# Patient Record
Sex: Female | Born: 1965 | Race: White | Hispanic: No | Smoking: Never smoker
Health system: Southern US, Community
[De-identification: ages and names within clinical notes are randomized; demographics above are authoritative.]

## PROBLEM LIST (undated history)

## (undated) DIAGNOSIS — D689 Coagulation defect, unspecified: Secondary | ICD-10-CM

## (undated) DIAGNOSIS — G8929 Other chronic pain: Secondary | ICD-10-CM

## (undated) DIAGNOSIS — M549 Dorsalgia, unspecified: Secondary | ICD-10-CM

## (undated) HISTORY — PX: ABDOMINAL HYSTERECTOMY: SHX81

---

## 1998-05-10 ENCOUNTER — Emergency Department (HOSPITAL_COMMUNITY): Admission: EM | Admit: 1998-05-10 | Discharge: 1998-05-10 | Payer: Self-pay | Admitting: Emergency Medicine

## 1998-07-03 ENCOUNTER — Emergency Department (HOSPITAL_COMMUNITY): Admission: EM | Admit: 1998-07-03 | Discharge: 1998-07-03 | Payer: Self-pay | Admitting: Emergency Medicine

## 1998-07-07 ENCOUNTER — Ambulatory Visit (HOSPITAL_COMMUNITY): Admission: RE | Admit: 1998-07-07 | Discharge: 1998-07-07 | Payer: Self-pay | Admitting: Obstetrics and Gynecology

## 1998-07-10 ENCOUNTER — Ambulatory Visit (HOSPITAL_COMMUNITY): Admission: RE | Admit: 1998-07-10 | Discharge: 1998-07-10 | Payer: Self-pay | Admitting: Obstetrics and Gynecology

## 1998-09-06 ENCOUNTER — Emergency Department (HOSPITAL_COMMUNITY): Admission: EM | Admit: 1998-09-06 | Discharge: 1998-09-06 | Payer: Self-pay | Admitting: Emergency Medicine

## 1998-10-22 ENCOUNTER — Emergency Department (HOSPITAL_COMMUNITY): Admission: EM | Admit: 1998-10-22 | Discharge: 1998-10-22 | Payer: Self-pay | Admitting: Emergency Medicine

## 1999-01-19 ENCOUNTER — Emergency Department (HOSPITAL_COMMUNITY): Admission: EM | Admit: 1999-01-19 | Discharge: 1999-01-19 | Payer: Self-pay | Admitting: Emergency Medicine

## 1999-02-20 ENCOUNTER — Encounter (INDEPENDENT_AMBULATORY_CARE_PROVIDER_SITE_OTHER): Payer: Self-pay | Admitting: *Deleted

## 1999-02-20 ENCOUNTER — Inpatient Hospital Stay (HOSPITAL_COMMUNITY): Admission: AD | Admit: 1999-02-20 | Discharge: 1999-02-28 | Payer: Self-pay | Admitting: Obstetrics and Gynecology

## 1999-02-20 ENCOUNTER — Encounter: Payer: Self-pay | Admitting: Obstetrics and Gynecology

## 1999-02-21 ENCOUNTER — Encounter: Payer: Self-pay | Admitting: Obstetrics and Gynecology

## 1999-02-22 ENCOUNTER — Encounter: Payer: Self-pay | Admitting: Obstetrics and Gynecology

## 1999-04-09 ENCOUNTER — Emergency Department (HOSPITAL_COMMUNITY): Admission: EM | Admit: 1999-04-09 | Discharge: 1999-04-09 | Payer: Self-pay | Admitting: Emergency Medicine

## 1999-04-17 ENCOUNTER — Other Ambulatory Visit: Admission: RE | Admit: 1999-04-17 | Discharge: 1999-04-17 | Payer: Self-pay | Admitting: Obstetrics and Gynecology

## 1999-05-08 ENCOUNTER — Emergency Department (HOSPITAL_COMMUNITY): Admission: EM | Admit: 1999-05-08 | Discharge: 1999-05-08 | Payer: Self-pay | Admitting: *Deleted

## 1999-06-20 ENCOUNTER — Encounter: Payer: Self-pay | Admitting: Nephrology

## 1999-06-20 ENCOUNTER — Encounter: Admission: RE | Admit: 1999-06-20 | Discharge: 1999-06-20 | Payer: Self-pay | Admitting: Nephrology

## 1999-07-20 ENCOUNTER — Emergency Department (HOSPITAL_COMMUNITY): Admission: EM | Admit: 1999-07-20 | Discharge: 1999-07-20 | Payer: Self-pay | Admitting: *Deleted

## 1999-08-06 ENCOUNTER — Emergency Department (HOSPITAL_COMMUNITY): Admission: EM | Admit: 1999-08-06 | Discharge: 1999-08-06 | Payer: Self-pay | Admitting: Emergency Medicine

## 1999-09-23 ENCOUNTER — Emergency Department (HOSPITAL_COMMUNITY): Admission: EM | Admit: 1999-09-23 | Discharge: 1999-09-23 | Payer: Self-pay | Admitting: Emergency Medicine

## 2000-05-21 ENCOUNTER — Other Ambulatory Visit: Admission: RE | Admit: 2000-05-21 | Discharge: 2000-05-21 | Payer: Self-pay | Admitting: Obstetrics and Gynecology

## 2000-08-06 ENCOUNTER — Emergency Department (HOSPITAL_COMMUNITY): Admission: EM | Admit: 2000-08-06 | Discharge: 2000-08-06 | Payer: Self-pay | Admitting: Emergency Medicine

## 2001-05-14 ENCOUNTER — Encounter (INDEPENDENT_AMBULATORY_CARE_PROVIDER_SITE_OTHER): Payer: Self-pay | Admitting: Specialist

## 2001-05-15 ENCOUNTER — Inpatient Hospital Stay (HOSPITAL_COMMUNITY): Admission: RE | Admit: 2001-05-15 | Discharge: 2001-05-18 | Payer: Self-pay | Admitting: Obstetrics and Gynecology

## 2001-07-18 ENCOUNTER — Emergency Department (HOSPITAL_COMMUNITY): Admission: EM | Admit: 2001-07-18 | Discharge: 2001-07-18 | Payer: Self-pay | Admitting: Emergency Medicine

## 2001-08-22 ENCOUNTER — Emergency Department (HOSPITAL_COMMUNITY): Admission: EM | Admit: 2001-08-22 | Discharge: 2001-08-22 | Payer: Self-pay | Admitting: Emergency Medicine

## 2002-04-04 ENCOUNTER — Emergency Department (HOSPITAL_COMMUNITY): Admission: EM | Admit: 2002-04-04 | Discharge: 2002-04-04 | Payer: Self-pay | Admitting: Emergency Medicine

## 2002-05-16 ENCOUNTER — Emergency Department (HOSPITAL_COMMUNITY): Admission: EM | Admit: 2002-05-16 | Discharge: 2002-05-16 | Payer: Self-pay | Admitting: Emergency Medicine

## 2002-09-21 ENCOUNTER — Other Ambulatory Visit: Admission: RE | Admit: 2002-09-21 | Discharge: 2002-09-21 | Payer: Self-pay | Admitting: Obstetrics and Gynecology

## 2003-03-07 ENCOUNTER — Emergency Department (HOSPITAL_COMMUNITY): Admission: EM | Admit: 2003-03-07 | Discharge: 2003-03-07 | Payer: Self-pay | Admitting: Emergency Medicine

## 2003-09-08 ENCOUNTER — Encounter (INDEPENDENT_AMBULATORY_CARE_PROVIDER_SITE_OTHER): Payer: Self-pay | Admitting: Specialist

## 2003-09-08 ENCOUNTER — Inpatient Hospital Stay (HOSPITAL_COMMUNITY): Admission: AD | Admit: 2003-09-08 | Discharge: 2003-09-08 | Payer: Self-pay | Admitting: *Deleted

## 2003-09-08 ENCOUNTER — Ambulatory Visit (HOSPITAL_COMMUNITY): Admission: RE | Admit: 2003-09-08 | Discharge: 2003-09-08 | Payer: Self-pay | Admitting: Obstetrics and Gynecology

## 2003-12-12 ENCOUNTER — Ambulatory Visit (HOSPITAL_COMMUNITY): Admission: RE | Admit: 2003-12-12 | Discharge: 2003-12-12 | Payer: Self-pay | Admitting: Obstetrics and Gynecology

## 2004-02-26 ENCOUNTER — Emergency Department (HOSPITAL_COMMUNITY): Admission: EM | Admit: 2004-02-26 | Discharge: 2004-02-26 | Payer: Self-pay | Admitting: Emergency Medicine

## 2004-03-04 ENCOUNTER — Emergency Department (HOSPITAL_COMMUNITY): Admission: EM | Admit: 2004-03-04 | Discharge: 2004-03-04 | Payer: Self-pay | Admitting: Emergency Medicine

## 2004-04-28 ENCOUNTER — Emergency Department (HOSPITAL_COMMUNITY): Admission: EM | Admit: 2004-04-28 | Discharge: 2004-04-28 | Payer: Self-pay | Admitting: *Deleted

## 2004-07-21 ENCOUNTER — Emergency Department (HOSPITAL_COMMUNITY): Admission: EM | Admit: 2004-07-21 | Discharge: 2004-07-21 | Payer: Self-pay | Admitting: Emergency Medicine

## 2004-08-15 ENCOUNTER — Emergency Department (HOSPITAL_COMMUNITY): Admission: EM | Admit: 2004-08-15 | Discharge: 2004-08-15 | Payer: Self-pay

## 2004-08-23 ENCOUNTER — Emergency Department (HOSPITAL_COMMUNITY): Admission: EM | Admit: 2004-08-23 | Discharge: 2004-08-23 | Payer: Self-pay | Admitting: Emergency Medicine

## 2004-09-10 ENCOUNTER — Emergency Department (HOSPITAL_COMMUNITY): Admission: EM | Admit: 2004-09-10 | Discharge: 2004-09-10 | Payer: Self-pay | Admitting: Emergency Medicine

## 2004-09-25 ENCOUNTER — Emergency Department (HOSPITAL_COMMUNITY): Admission: EM | Admit: 2004-09-25 | Discharge: 2004-09-25 | Payer: Self-pay | Admitting: Emergency Medicine

## 2004-10-05 ENCOUNTER — Emergency Department (HOSPITAL_COMMUNITY): Admission: EM | Admit: 2004-10-05 | Discharge: 2004-10-05 | Payer: Self-pay | Admitting: Emergency Medicine

## 2004-11-17 ENCOUNTER — Emergency Department (HOSPITAL_COMMUNITY): Admission: EM | Admit: 2004-11-17 | Discharge: 2004-11-17 | Payer: Self-pay | Admitting: Emergency Medicine

## 2005-05-22 ENCOUNTER — Emergency Department (HOSPITAL_COMMUNITY): Admission: EM | Admit: 2005-05-22 | Discharge: 2005-05-22 | Payer: Self-pay | Admitting: Emergency Medicine

## 2005-06-05 ENCOUNTER — Emergency Department (HOSPITAL_COMMUNITY): Admission: EM | Admit: 2005-06-05 | Discharge: 2005-06-05 | Payer: Self-pay | Admitting: Emergency Medicine

## 2005-06-20 ENCOUNTER — Emergency Department (HOSPITAL_COMMUNITY): Admission: EM | Admit: 2005-06-20 | Discharge: 2005-06-20 | Payer: Self-pay | Admitting: Emergency Medicine

## 2005-07-08 ENCOUNTER — Emergency Department (HOSPITAL_COMMUNITY): Admission: EM | Admit: 2005-07-08 | Discharge: 2005-07-08 | Payer: Self-pay | Admitting: *Deleted

## 2005-07-13 ENCOUNTER — Emergency Department (HOSPITAL_COMMUNITY): Admission: EM | Admit: 2005-07-13 | Discharge: 2005-07-13 | Payer: Self-pay | Admitting: Emergency Medicine

## 2005-08-04 ENCOUNTER — Emergency Department (HOSPITAL_COMMUNITY): Admission: EM | Admit: 2005-08-04 | Discharge: 2005-08-05 | Payer: Self-pay | Admitting: Emergency Medicine

## 2005-08-29 ENCOUNTER — Emergency Department (HOSPITAL_COMMUNITY): Admission: EM | Admit: 2005-08-29 | Discharge: 2005-08-29 | Payer: Self-pay | Admitting: Emergency Medicine

## 2005-11-24 ENCOUNTER — Emergency Department (HOSPITAL_COMMUNITY): Admission: EM | Admit: 2005-11-24 | Discharge: 2005-11-24 | Payer: Self-pay | Admitting: Emergency Medicine

## 2005-12-17 ENCOUNTER — Encounter: Admission: RE | Admit: 2005-12-17 | Discharge: 2005-12-17 | Payer: Self-pay | Admitting: *Deleted

## 2005-12-18 ENCOUNTER — Encounter: Payer: Self-pay | Admitting: Obstetrics and Gynecology

## 2006-01-11 ENCOUNTER — Emergency Department (HOSPITAL_COMMUNITY): Admission: EM | Admit: 2006-01-11 | Discharge: 2006-01-12 | Payer: Self-pay | Admitting: Emergency Medicine

## 2006-02-16 ENCOUNTER — Emergency Department (HOSPITAL_COMMUNITY): Admission: EM | Admit: 2006-02-16 | Discharge: 2006-02-16 | Payer: Self-pay | Admitting: Emergency Medicine

## 2006-04-25 ENCOUNTER — Encounter: Admission: RE | Admit: 2006-04-25 | Discharge: 2006-04-25 | Payer: Self-pay | Admitting: Gastroenterology

## 2006-04-30 ENCOUNTER — Encounter: Admission: RE | Admit: 2006-04-30 | Discharge: 2006-04-30 | Payer: Self-pay | Admitting: Gastroenterology

## 2006-07-19 ENCOUNTER — Emergency Department (HOSPITAL_COMMUNITY): Admission: EM | Admit: 2006-07-19 | Discharge: 2006-07-19 | Payer: Self-pay | Admitting: Emergency Medicine

## 2006-11-07 ENCOUNTER — Emergency Department (HOSPITAL_COMMUNITY): Admission: EM | Admit: 2006-11-07 | Discharge: 2006-11-07 | Payer: Self-pay | Admitting: Emergency Medicine

## 2007-01-31 ENCOUNTER — Emergency Department (HOSPITAL_COMMUNITY): Admission: EM | Admit: 2007-01-31 | Discharge: 2007-01-31 | Payer: Self-pay | Admitting: *Deleted

## 2007-08-15 ENCOUNTER — Emergency Department (HOSPITAL_COMMUNITY): Admission: EM | Admit: 2007-08-15 | Discharge: 2007-08-15 | Payer: Self-pay | Admitting: Emergency Medicine

## 2008-02-28 ENCOUNTER — Inpatient Hospital Stay (HOSPITAL_COMMUNITY): Admission: AD | Admit: 2008-02-28 | Discharge: 2008-02-28 | Payer: Self-pay | Admitting: Obstetrics and Gynecology

## 2008-03-09 ENCOUNTER — Ambulatory Visit (HOSPITAL_COMMUNITY): Admission: RE | Admit: 2008-03-09 | Discharge: 2008-03-09 | Payer: Self-pay | Admitting: Obstetrics and Gynecology

## 2008-04-28 ENCOUNTER — Encounter: Admission: RE | Admit: 2008-04-28 | Discharge: 2008-04-28 | Payer: Self-pay | Admitting: Chiropractic Medicine

## 2008-08-30 ENCOUNTER — Encounter: Admission: RE | Admit: 2008-08-30 | Discharge: 2008-08-30 | Payer: Self-pay | Admitting: Family Medicine

## 2009-09-04 ENCOUNTER — Encounter: Admission: RE | Admit: 2009-09-04 | Discharge: 2009-09-04 | Payer: Self-pay | Admitting: Gastroenterology

## 2010-09-08 ENCOUNTER — Encounter: Payer: Self-pay | Admitting: Neurology

## 2010-09-09 ENCOUNTER — Encounter: Payer: Self-pay | Admitting: Gastroenterology

## 2010-09-09 ENCOUNTER — Encounter: Payer: Self-pay | Admitting: Obstetrics and Gynecology

## 2010-10-23 ENCOUNTER — Other Ambulatory Visit: Payer: Self-pay | Admitting: Neurology

## 2010-10-23 DIAGNOSIS — R519 Headache, unspecified: Secondary | ICD-10-CM

## 2010-10-23 DIAGNOSIS — R2681 Unsteadiness on feet: Secondary | ICD-10-CM

## 2010-10-23 DIAGNOSIS — R4781 Slurred speech: Secondary | ICD-10-CM

## 2010-12-23 ENCOUNTER — Emergency Department (HOSPITAL_COMMUNITY)
Admission: EM | Admit: 2010-12-23 | Discharge: 2010-12-23 | Disposition: A | Discharge status: Home / Self Care | Payer: BC Managed Care – PPO | Attending: Emergency Medicine | Admitting: Emergency Medicine

## 2010-12-23 DIAGNOSIS — R269 Unspecified abnormalities of gait and mobility: Secondary | ICD-10-CM | POA: Insufficient documentation

## 2010-12-23 DIAGNOSIS — G43909 Migraine, unspecified, not intractable, without status migrainosus: Secondary | ICD-10-CM | POA: Insufficient documentation

## 2010-12-23 DIAGNOSIS — R112 Nausea with vomiting, unspecified: Secondary | ICD-10-CM | POA: Insufficient documentation

## 2010-12-23 DIAGNOSIS — H538 Other visual disturbances: Secondary | ICD-10-CM | POA: Insufficient documentation

## 2010-12-23 DIAGNOSIS — R29898 Other symptoms and signs involving the musculoskeletal system: Secondary | ICD-10-CM | POA: Insufficient documentation

## 2011-01-01 NOTE — Op Note (Signed)
NAME:  Kelsey Lang, ANGELO NO.:  [id number]   MEDICAL RECORD NO.:  [id number]          PATIENT TYPE:  AMB   LOCATION:  SDC                           FACILITY:  [facility]   PHYSICIAN:  Juluis Mire, M.D.   DATE OF BIRTH:  08/10/1966   DATE OF PROCEDURE:  03/09/2008  DATE OF DISCHARGE:                               OPERATIVE REPORT   PREOPERATIVE DIAGNOSIS:  Chronic pelvic pain.   POSTOPERATIVE DIAGNOSIS:  Chronic pelvic pain.   PROCEDURE:  1. Open laparoscopy.  2. Cautery of peritoneal defect.  3. Injection of vaginal cuff with a combination of Marcaine and      dexamethasone.   SURGEON:  Juluis Mire, MD   ANESTHESIA:  General.   ESTIMATED BLOOD LOSS:  Minimal.   PACKS AND DRAINS:  None.   INTRAOPERATIVE BLOOD PLACED:  None.   COMPLICATIONS:  None.   INDICATIONS:  Dictated history and physical.   PROCEDURE:  The patient was taken to the OR and placed in supine  position.  After satisfactory level of general endotracheal anesthesia  was obtained, the abdomen, perineum, and vagina were prepped out with  Betadine and draped as a sterile field.  Bladder was emptied by  catheterization.  A subumbilical incision was made with a knife.  The  incision was extended through the subcutaneous tissue.  Fascia was  entered sharp and incision fashioned laterally.  The peritoneum was  entered with blunt pressure.  The open laparoscopic trocar was inserted  and deployed.  Having insufflated with carbon dioxide, laparoscope was  introduced.  There was no evidence of injury to adjacent organs.  A 5-mm  trocar was put in place in suprapubic area under direct visualization.  Visualization revealed the uterus and the right ovary be missing.  The  left ovary was free and unremarkable.  She had one peritoneal defect on  the right pelvic side wall well above the ureter.  Otherwise, there were  no abnormalities.  The appendix was visualized, a normal upper abdomen  including the  liver were clear.  Using the bipolar, we cauterized along  the pelvic brim, the area where the ovary had been and the peritoneal  defect.  This was well above the ureter.  We then went vaginally and  injected the vaginal cuff with a combination of dexamethasone and 0.5%  Marcaine.  We had completely crossed the cuff making sure all the area  was injected appropriately.  We had some minimal bleeding from that.  At  this point in time, we re-looked through the laparoscope, everything was  clear.  The abdomen was deinflated with carbon dioxide.  All trocars  were removed.  Subumbilical fascia was closed with a figure-of-eight of  0 Vicryl, skin with interrupted subcuticular with 4-0 Vicryl.  The  suprapubic incision was closed with Dermabond.  Sponge, instrument, and  needle count was correct by circulating nurse x2.  The patient was taken  out of the dorsal supine position, and once alert and extubated,  transferred to recovery room in good condition.      Juluis Mire,  M.D.  Electronically Signed     JSM/MEDQ  D:  03/09/2008  T:  03/10/2008  Job:  [id number]

## 2011-01-01 NOTE — H&P (Signed)
NAME:  Kelsey Lang, WACHTER NO.:  [id number]   MEDICAL RECORD NO.:  [id number]          PATIENT TYPE:  AMB   LOCATION:  SDC                           FACILITY:  [facility]   PHYSICIAN:  Juluis Mire, M.D.   DATE OF BIRTH:  August 03, 1966   DATE OF ADMISSION:  03/09/2008  DATE OF DISCHARGE:  09/07/2007                              HISTORY & PHYSICAL   The patient is a 45 year old gravida 2, para 2 female presents for  diagnostic laparoscopy and injection of vaginal cuff in relation to the  present admission.  The patient had a previous laparoscopic-assisted  vaginal hysterectomy for management of pelvic pain and discomfort.  She  had persistent right lower quadrant discomfort and pain, underwent a  right salpingo-oophorectomy.  She still has some right-sided pain.  She  has had previous GI and Urology evaluation which was negative.  At the  present time, we are going to proceed with repeat laparoscopy for  evaluation.  We will also inject the vaginal cuff.   In terms of allergies, she is allergic to TYLENOL and PENICILLIN.   MEDICATIONS:  Topamax 100 mg 2 times daily, Plavix 1 time daily,  verapamil once a day, and then otherwise supplements.  She also is using  Demerol for pain and Phenergan for nausea.   PAST MEDICAL HISTORY:  1. 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  She did have      a marked decrease in renal function that gradually improved.  She      had been followed by Dr. Darrick Penna for that.  2. She is found to have an antiphospholipid syndrome in the form of      anticardiolipin antibody of the IgM type.  This was mildly      elevated.  She has had no history of deep venous thrombosis.  3. She has a history of chronic migraines.  4. She has a history of some mild elevated blood pressures, has been      on medications in the past.   PAST OBSTETRICAL HISTORY:  She has had 2 cesarean sections.   SURGICAL HISTORY:  In 1998, she had a laparoscopy.  In  1989, she had  laparoscopy and uterine suspension.  In 1992, she had a repeat  laparoscopy for chronic pelvic pain with find of endometriosis and  uterine fibroids.  In 1994, she had a laparoscopy.  In 1996, she had a  laparoscopy and then subsequently in 2002, she had a laparoscopic-  assisted vaginal hysterectomy.  Subsequently, in 2005, she had a  laparoscopic removal of right tube and ovary.  Again, she is continued  to have pain.  Ultrasound evaluations have been unremarkable.   FAMILY HISTORY:  Noncontributory.   SOCIAL HISTORY:  Reveals no tobacco or alcohol use.   REVIEW OF SYSTEMS:  Noncontributory.   PHYSICAL EXAMINATION:  VITAL SIGNS:  The patient is afebrile, stable  vital signs.  HEENT:  The patient is normocephalic.  Pupils equal, round, and reactive  to light and accommodation.  Extraocular movements were intact.  Sclerae  and conjunctivae are clear.  Oropharynx clear.  NECK:  Without thyromegaly.  BREASTS:  No discrete masses.  LUNGS:  Clear.  CARDIOVASCULAR SYSTEM:  Regular rate without murmurs or gallops.  ABDOMINAL:  Benign.  No mass, organomegaly, or tenderness.  PELVIC:  Normal external genitalia.  Vaginal mucosa is clear.  Pelvis  intact.  She has some right-sided tenderness.  Left adnexa unremarkable.  EXTREMITIES:  Trace edema.  NEURO:  Alert and oriented.   IMPRESSION:  1. Chronic pelvic pain despite previous laparoscopic-assisted vaginal      hysterectomy, right salpingo-oophorectomy.  2. History of antiphospholipid with mildly elevated anticardiolipin.   PLAN:  For prophylaxis against DVTs, we will place her on compression  stockings.  We are going to avoid heparin.  She has had a history of  hematoma formation in the past.  The risks of surgery explained  including the risk of infection.  The risk of hemorrhage that could  require transfusion with the risk of AIDS or hepatitis.  Risk of injury  to adjacent organs including bladder, bowel, or ureters  that could  require further exploratory surgery.  Risk of deep venous thrombosis and  pulmonary embolus.      Juluis Mire, M.D.  Electronically Signed     JSM/MEDQ  D:  03/09/2008  T:  03/10/2008  Job:  [id number]

## 2011-01-04 NOTE — Op Note (Signed)
[facility]  Patient:    Kelsey Lang, Kelsey Lang [facility] Visit Number: [id number] MRN: [id number]          Service Type: DSU Location: DAY Attending Physician:  Frederich Balding Proc. Date: 05/14/01 Admit Date:  05/14/2001                             Operative Report  PREOPERATIVE DIAGNOSES:  Pelvic endometriosis, endometrial polyp with pathology pending.  POSTOPERATIVE DIAGNOSES:  Pelvic endometriosis, endometrial polyp with pathology pending.  OPERATIVE PROCEDURE:  Laparoscopically-assisted vaginal hysterectomy using open laparoscopy.  SURGEON:  Juluis Mire, M.D.  ASSISTANT:  Trevor Iha, M.D.  ANESTHESIA:  General endotracheal.  ESTIMATED BLOOD LOSS:  300 cc.  PACKS AND DRAINS:  None.  INTRAOPERATIVE FLUID REPLACEMENT:  None.  COMPLICATIONS:  None.  INDICATION:  Dictated in the history and physical.  DESCRIPTION OF PROCEDURE:  The patient taken to the OR and placed in supine position.  After satisfactory level of general endotracheal obtained, the patient was placed in the dorsal lithotomy position using the Allen stirrups. The abdomen, perineum, and vagina were prepped out with Betadine.  Exam under anesthesia revealed the uterus to be mid position, normal size and shape, adnexa unremarkable.  Bladder was emptied by in-and-out catheterization.  A Hulka tenaculum was put in place and secured.  The patient then draped out as a sterile field.  A subumbilical incision was made with the knife carried to the subcutaneous tissue.  The fascia was then entered sharply and incision extended laterally.  The peritoneum was then entered sharply.  There was no evidence of any periumbilical adhesions.  Two lateral fascial sutures were put in place of 0 Vicryl.  The Hasson cannula was then put in place and secured with the Vicryl sutures.  The abdomen was insufflated with carbon dioxide. The laparoscope was introduced.  There was no evidence  of injury to adjacent organs.  A 5 mm trocar was put in place in the suprapubic area and the left lower quadrant lateral to the epigastric vessels.  The uterus was of normal size and shape.  Tubes and ovaries were unremarkable.  She did have areas of endometriosis in the cul-de-sac area.  The appendix was visualized and noted to be normal.  Upper abdomen, including the liver and tip of the gallbladder, were clear.  Next, the Ligasure was brought into place and placed through the subumbilical port.  A 5 mm laparoscope was introduced into the port in the left lower quadrant.  We began working on the left adnexa.  First the left uteroovarian pedicle was cauterized with the Ligasure and incised.  The left tube and mesosalpinx was cauterized and incised.  The left tube and mesosalpinx was cauterized and incised.  The left round ligament was cauterized and incised.  We had complete separation of the adnexa and good hemostasis.  We then went to the right side.  The right uteroovarian pedicle was cauterized and incised.  The right tube and mesosalpinx were cauterized and incised.  The right round ligament was cauterized and incised.  Good freeing of the adnexa and excellent hemostasis.  The 5 mm laparoscope was then removed, and the operative laparoscope was reintroduced to the subumbilical port.  Visualization with good hemostasis and adequate freeing of the adnexa. Both ureters were easily identified in their course of the pelvic sidewall and uninvolved in the operative procedure.  At this point in time, we decided  to proceed vaginally.  The patient was then deflated of carbon dioxide, laparoscope was removed, and the legs were repositioned.  A weighted speculum was placed in the vaginal vault.  The cervix was grasped with a Christella Hartigan tenaculum.  The cul-de-sac was entered sharply.  Both uterosacral ligaments were clamped, cut, and suture ligated with 0 Vicryl with these being held.  The reflection  of the vaginal mucosa anteriorly was incised, and we began sharply dissecting bilaterally off the lower uterine segment.  The paracervical tissue was clamped, cut, and suture ligated with 0 Vicryl.  The vesicouterine space was then entered sharply and a retractor put in place to retract the bladder superiorly.  Using the clamp, cut, and tie technique, with suture ligature of 0 Vicryl, the parametrium was serially separated from the sides of the uterus.  The uterus was then flipped and the remaining pedicles clamped and cut and the uterus passed off the operative field.  The remaining pedicle was ligated with free tie of 0 Vicryl.  The posterior cuff was then run with a running suture of 0 Vicryl.  The vaginal mucosa was reapproximated in a vertical fashion with figure-of-eights of 0 Vicryl.  Foley was placed to straight drain with retrieval of adequate amount of clear urine.  We had good hemostasis.  The weighted speculum was then removed.  Sponge and a sponge stick was placed in the vaginal vault.  The laparoscope was then reintroduced.  The abdomen was reinflated with carbon dioxide.  Visualization revealed some oozing from the top of the vaginal cuff brought under control with the bipolar.  She also had some bleeding from the bladder base brought under control with the bipolar.  Otherwise, we had excellent hemostasis and good clear urine output.  The abdomen was deflated of its carbon dioxide, all trocars remained.  The subumbilical fascia was closed with a figure-of-eight of 0 Vicryl.  The skin was closed with interrupted subcuticulars of 4-0 Vicryl.  Suprapubic incision was covered with Steri-Strips.  The sponge and sponge stick was then removed.  Sponge, needle, and instrument counts were reported as correct by the circulating nurse x 2. Foley catheter remained clear at the time of closure.  The patient tolerated the procedure well and returned to the recovery room in good  condition. Attending Physician:  Frederich Balding DD:  05/14/01 TD:  05/14/01 Job: [id number]

## 2011-01-04 NOTE — H&P (Signed)
[facility]  Patient:    Kelsey Lang, Kelsey Lang Visit Number: [id number] MRN: [id number]          Service Type: Attending:  Juluis Mire, M.D. Dictated by:   Juluis Mire, M.D. Adm. Date:  05/14/01                           History and Physical  HISTORY OF PRESENT ILLNESS:  [age]-year-old gravida 2, para 2, married white female, presents for laparoscopic-assisted vaginal hysterectomy for management of symptomatic pelvic endometriosis.  In relation to the present admission, the patient has a long-standing history of chronic pelvic pain and abnormal bleeding, thought to be secondary to pelvic endometriosis.  She presented to the office complaining of worsening menstrual irregularities.  Her cycles will last at the present time up to approximately two weeks.  During that time she has on and off heavy bleeding. She is also having significant increasing discomfort and pain with this.  We are somewhat limited in terms of management.  She does have an antiphospholipid syndrome with a positive anticardiolipin.  Therefore, birth control pills are contraindicated.  Because of continued bleeding, we did a saline infusion ultrasound.  She did have a large intrauterine polyp.  Both ovaries, otherwise, appeared to be normal.  She has had numerous laparoscopies in the past for management of pelvic pain with the finding of endometriosis. In view of the bleeding, the polyp, and the known endometriosis, and the inability to use birth control pills, the patient has decided to proceed with more aggressive therapy in the form of laparoscopic-assisted vaginal hysterectomy, for which she is admitted at the present time.  ALLERGIES:  In terms of allergies, the patient is allergic to ______  and PENICILLIN.  MEDICATIONS:  Lopressor for migraine headaches.  Periactin.  PAST MEDICAL HISTORY:  Is significant in that with her last pregnancy she developed HELLP syndrome.   She had a marked decrease in her renal function. This has gradually improved, and she is followed by Dr. Darrick Penna for that. She was also found to have an antiphospholipid antibody in the form of anticardiolipin of the IgM type.  This is mildly elevated.  She has had further evaluation for hematologic disorders which have been negative.  She does have a history of migraines, for which she is under active management. She has also had mildly elevated blood pressures.  OBSTETRICAL HISTORY:  She has had two cesarean sections.  From a surgical standpoint, in 1998, she had a laparoscopy with the finding of pelvic endometriosis.  In 1989, she had a subsequent laparoscopic uterine suspension. She then had a repeat laparoscopy done in 1992, with the finding of pelvic endometriosis and a small uterine fibroid.  Because of continued symptomatology in relation to her pelvis, she had a repeat laparoscopy in 1994, again with the finding of recurrent pelvic endometriosis that was actively treated.  She had one more laparoscopy that was done in 1996.  At that time we did not really see any active endometriosis to speak of.  FAMILY HISTORY:  Noncontributory.  SOCIAL HISTORY:  No tobacco or alcohol use.  REVIEW OF SYSTEMS:  Noncontributory.  PHYSICAL EXAMINATION:  VITAL SIGNS:  Afebrile, stable vital signs.  HEENT:  Normocephalic.  Pupils are equal, round, and reactive to light and accommodation.  Extraocular movements intact.  Sclerae and conjunctivae clear. Oropharynx clear.  NECK:  Without thyromegaly.  BREASTS:  No discrete masses.  LUNGS:  Clear.  CARDIOVASCULAR:  Regular rhythm and rate.  No murmurs or gallops.  ABDOMEN:  Benign.  No masses or organomegaly or tenderness.  Does have well-healed low transverse and umbilical incisions.  PELVIC:  Normal external genitalia.  Vaginal mucosa clear.  Cervix unremarkable.  Uterus ______ normal size.  Adnexa unremarkable.  EXTREMITIES:  Trace  edema.  NEUROLOGIC:  Grossly within normal limits.  IMPRESSION: 1. Menorrhagia and dysmenorrhea secondary to intrauterine polyp and possible    recurrent endometriosis. 2. History of antiphospholipid syndrome. 3. History of borderline renal function.  PLAN:  After discussion of options, the patient will proceed with laparoscopic-assisted vaginal hysterectomy.  The risks of surgery have been discussed including the risk of anesthesia; the risk of infection; the risk of hemorrhage which could necessitate transfusion with the risk of AIDS or hepatitis; the risk of injury to adjacent organs including bladder, bowel, ureters which could require further exploratory surgery; the risk of deep vein thrombosis and pulmonary embolus.  The patient appears to understand indications and risks. Dictated by:   Juluis Mire, M.D. Attending:  Juluis Mire, M.D. DD:  05/14/01 TD:  05/14/01 Job: [id number]

## 2011-01-04 NOTE — Discharge Summary (Signed)
[facility]  Patient:    Kelsey Lang, Kelsey Lang [facility] Visit Number: [id number] MRN: [id number]          Service Type: SUR Location: 4W 0481 01 Attending Physician:  Frederich Balding Dictated by:   Juluis Mire, M.D. Admit Date:  05/14/2001 Disc. Date: 05/18/01                             Discharge Summary  ADMISSION DIAGNOSES: 1. Menorrhagia and dysmenorrhea secondary to intrauterine polyp and possible    recurrent pelvic endometriosis. 2. History of antiphospholipid syndrome. 3. History of borderline renal function.  DISCHARGE DIAGNOSES: 1. Menorrhagia and dysmenorrhea secondary to intrauterine polyp and possible    recurrent pelvic endometriosis. 2. History of antiphospholipid syndrome. 3. History of borderline renal function.  OPERATIVE PROCEDURE:  Laparoscopic assisted vaginal hysterectomy.  For history and physical see dictated note.  COURSE IN THE HOSPITAL:  The patient underwent the above noted surgery. Postoperatively she did well.  On her first postoperative day she was having trouble with some nausea.  Her postoperative hemoglobin was 7.5 which was within the drop that we had anticipated from the amount of blood loss. Rechecked that afternoon it had risen to 8.0.  We maintained her in the hospital throughout the day and through the night she had worsening nausea and vomiting.  Her abdominal exam was benign.  Abdomen was soft.  She had positive bowel sounds and actually was passing flatus on her second postoperative day. Her hemoglobin; however, had dropped down to 6.7.  We were not sure whether this was from further hydration.  Therefore, we continued to watch her.  The nausea and vomiting continued to be a problem despite trying alternative pain medications.  We ended up starting back on the morphine pump and restarting her IV and her hemoglobin, by that afternoon, had dropped down to 6.3.  In view of the nausea and vomiting and  slowly declining blood count we decided to transfuse the patient with two units of packed red blood cells.  The risks were discussed, including the risk of AIDS or hepatitis.  She underwent transfusion of two units of packed red blood cells through the evening of the 28th.  The next morning her hemoglobin was 10.3.  She was feeling much better at that point in time.  She had a great day on the 29th, tolerating her diet, ambulating without difficulty, and overall feeling much better.  On the morning of the 30th she was afebrile.  Her abdomen was soft and nontender.  Bowel sounds were active.  She was passing flatus.  She was urinating without difficulty.  She was ambulating without difficulty.  She had no active vaginal bleeding and was ready for discharge.  Terms and complications are noted above.  The patient discharged home in stable condition by physician; routine postop instructions were given.  She is to avoid heavy lifting, vaginal entrance, or driving her car.  Discharged home on Demerol for pain and Zofran for nausea.  She is to report fever, nausea, vomiting, increased abdominal pain, or active vaginal bleeding. Will follow up in the office in one week. Dictated by:   Juluis Mire, M.D. Attending Physician:  Frederich Balding DD:  05/18/01 TD:  05/18/01 Job: [id number]

## 2011-01-04 NOTE — Op Note (Signed)
NAME:  Kelsey Lang, Kelsey Lang                        ACCOUNT NO.:  [id number]   MEDICAL RECORD NO.:  [id number]                   PATIENT TYPE:  AMB   LOCATION:  SDC                                  FACILITY:  [facility]   PHYSICIAN:  Jamison Neighbor, M.D.               DATE OF BIRTH:  1965/10/30   DATE OF PROCEDURE:  09/08/2003  DATE OF DISCHARGE:                                 OPERATIVE REPORT   PREOPERATIVE DIAGNOSIS:  Right-sided pelvic pain.   SECONDARY DIAGNOSIS:  Ovarian cyst.   POSTOPERATIVE DIAGNOSIS:  Right-sided pelvic pain.   PROCEDURE:  Cystoscopy, bilateral retrogrades, and bilateral stent  placement.   SURGEON:  Jamison Neighbor, M.D.   ANESTHESIA:  General.   COMPLICATIONS:  None.   DRAINS:  6 French open-ended ureteral catheters.   HISTORY:  This 45 year old female underwent evaluation of unexplained pelvic  pain.  The patient was evaluated with cystoscopy, which revealed an  unremarkable bladder.  Renal ultrasound showed unremarkable kidneys.  Pelvic  ultrasound showed a large cystic structure on the right-hand side.  The  patient's pain was felt to be due to that cystic structure, and she is now  to undergo laparoscopic removal of the ovary by Kelsey Lang, M.D.  The  patient needs to have stents placed in preparation for the procedure in  order to aid Dr. Arelia Lang in identifying the ureters.  The patient understands  the risks and benefits of the procedure and gave full informed consent.   PROCEDURE:  After the successful induction of general anesthesia, the  patient was placed in the dorsal lithotomy position and prepped with  Betadine and draped in the usual sterile fashion.  Cystoscopy was performed,  and the bladder was carefully inspected.  It was free of any tumor or  stones.  Both ureteral orifices were normal in configuration and location.  The left ureter was cannulated with a guidewire.  The ureteral catheter was  passed over the wire and up into the  pelvis.  The retrograde study showed  normal upper tracts on that side.  The stent was left in place.  On the  right-hand side the ureter was cannulated and the wire was passed up to the  kidney.  The ureter was a little redundant and floppy in the area of the  UPJ, and there was some dilation of the collecting system.  A stent was left  in place on that side as well.  The position was ascertained to be correct  with fluoroscopy.  A Foley catheter was  inserted.  The catheters were brought through small puncture holes at the  end of the Foley catheter so that they would drain into a single bag.  They  were tied together, and they will all be removed at the end of the  procedure.  Dr. Arelia Lang then re-prepped and draped the patient in preparation  for the laparoscopy.  Jamison Neighbor, M.D.    RJE/MEDQ  D:  09/08/2003  T:  09/08/2003  Job:  [id number]   cc:   Kelsey Lang, M.D.  [address] [place]  [place] [address]  Fax: [phone]

## 2011-01-04 NOTE — Op Note (Signed)
NAME:  Kelsey Lang, Kelsey Lang                        ACCOUNT NO.:  [id number]   MEDICAL RECORD NO.:  [id number]                   PATIENT TYPE:  AMB   LOCATION:  SDC                                  FACILITY:  [facility]   PHYSICIAN:  Juluis Mire, M.D.                DATE OF BIRTH:  [birth year]   DATE OF PROCEDURE:  09/08/2003  DATE OF DISCHARGE:                                 OPERATIVE REPORT   PREOPERATIVE DIAGNOSIS:  Pelvic pain.   POSTOPERATIVE DIAGNOSIS:  Pelvic pain.   OPERATIVE PROCEDURE:  Open laparoscopy with right salpingo-oophorectomy.   SURGEON:  Juluis Mire, M.D.   ANESTHESIA:  General endotracheal.   ESTIMATED BLOOD LOSS:  Minimal.   PACKS AND DRAINS:  None.   INTRAOPERATIVE BLOOD REPLACED:  None.   COMPLICATIONS:  None.   INDICATIONS:  Noted in the history and physical.   The procedure is as follows:  The patient taken to the OR.  She was placed  in the dorsal lithotomy position using the Allen stirrups.  Dr. Logan Bores came  in and placed bilateral ureteral stents and a Foley catheter.  At this point  in time the abdomen was re-prepped with Betadine and draped in a sterile  field, a subumbilical incision made with a knife, carried through  subcutaneous tissue.  The fascia was identified and entered sharply and the  incision in the fascia extended laterally.  We then entered the peritoneum.  There was no evidence of injury to adjacent organs.  An open laparoscopic  trocar was put in place and secured.  The laparoscope was introduced.  There  was no evidence of injury to adjacent organs.  The upper abdomen, including  the liver and the tip of the gallbladder, were clear.  Both lateral gutters  were clear.  Her appendix was visualized and noted to be unremarkable.  Both  ovaries appeared to be relatively normal.  There were no adhesive processes  or active pelvic processes such as endometriosis.  The left ovary appeared  to be normal with no adhesions.  The decision was  to proceed with removal of  the right ovary.  A 5 mm trocar was put in place in the suprapubic area  under direct visualization, the ovary was elevated.  Using the Gyrus  bipolar, the ovarian vasculature was cauterized and incised.  The ovary was  removed through the umbilical incision.  We irrigated the pelvis.  There was  no active bleeding or sign of complications.  Both ureteral stents were  easily identified.  At this point in time the abdomen was deflated of carbon  dioxide, all trocars removed, the subumbilical fascia closed with figure-of-  eights of 0 Vicryl, the skin was closed with interrupted subcuticular of 4-0  Vicryl.  The suprapubic incision was closed with Dermabond.  The Foley and  ureteral catheters were removed intact.  The patient taken out of the dorsal  lithotomy position and once alert and extubated, transferred to the recovery  room in good condition.  Sponge, instrument, and needle count reported as  correct by the circulating nurse.                                               Juluis Mire, M.D.    JSM/MEDQ  D:  09/08/2003  T:  09/08/2003  Job:  [id number]

## 2011-04-28 ENCOUNTER — Emergency Department (HOSPITAL_COMMUNITY)
Admission: EM | Admit: 2011-04-28 | Discharge: 2011-04-29 | Disposition: A | Discharge status: Home / Self Care | Payer: BC Managed Care – PPO | Attending: Emergency Medicine | Admitting: Emergency Medicine

## 2011-04-28 DIAGNOSIS — F329 Major depressive disorder, single episode, unspecified: Secondary | ICD-10-CM | POA: Insufficient documentation

## 2011-04-28 DIAGNOSIS — F1910 Other psychoactive substance abuse, uncomplicated: Secondary | ICD-10-CM | POA: Insufficient documentation

## 2011-04-28 DIAGNOSIS — F3289 Other specified depressive episodes: Secondary | ICD-10-CM | POA: Insufficient documentation

## 2011-04-28 LAB — COMPREHENSIVE METABOLIC PANEL
AST: 23 U/L (ref 0–37)
Albumin: 4.1 g/dL (ref 3.5–5.2)
Alkaline Phosphatase: 129 U/L — ABNORMAL HIGH (ref 39–117)
BUN: 4 mg/dL — ABNORMAL LOW (ref 6–23)
CO2: 28 mEq/L (ref 19–32)
Chloride: 98 mEq/L (ref 96–112)
Creatinine, Ser: 0.69 mg/dL (ref 0.50–1.10)
GFR calc non Af Amer: >60 mL/min (ref >60)
Potassium: 3.4 mEq/L — ABNORMAL LOW (ref 3.5–5.1)
Total Bilirubin: 0.3 mg/dL (ref 0.3–1.2)

## 2011-04-28 LAB — DIFFERENTIAL
Basophils Absolute: 0 10*3/uL (ref 0.0–0.1)
Eosinophils Relative: 6 % — ABNORMAL HIGH (ref 0–5)
Lymphocytes Relative: 24 % (ref 12–46)
Lymphs Abs: 2 10*3/uL (ref 0.7–4.0)
Neutro Abs: 5.2 10*3/uL (ref 1.7–7.7)
Neutrophils Relative %: 63 % (ref 43–77)

## 2011-04-28 LAB — CBC
HCT: 39.8 % (ref 36.0–46.0)
MCV: 87.9 fL (ref 78.0–100.0)
Platelets: 318 10*3/uL (ref 150–400)
RBC: 4.53 MIL/uL (ref 3.87–5.11)
RDW: 13.4 % (ref 11.5–15.5)
WBC: 8.2 10*3/uL (ref 4.0–10.5)

## 2011-04-28 LAB — RAPID URINE DRUG SCREEN, HOSP PERFORMED
Amphetamines: NOT DETECTED
Tetrahydrocannabinol: NOT DETECTED

## 2011-05-16 LAB — CBC
Hemoglobin: 13
RBC: 4.18
WBC: 6.4

## 2011-05-17 LAB — CBC
HCT: 42.7
Hemoglobin: 14.3
MCHC: 33.5
MCV: 91.9
RBC: 4.64
WBC: 6.2

## 2011-12-01 ENCOUNTER — Encounter (HOSPITAL_COMMUNITY): Payer: Self-pay | Admitting: Emergency Medicine

## 2011-12-01 ENCOUNTER — Emergency Department (HOSPITAL_COMMUNITY): Payer: BC Managed Care – PPO

## 2011-12-01 ENCOUNTER — Emergency Department (HOSPITAL_COMMUNITY)
Admission: EM | Admit: 2011-12-01 | Discharge: 2011-12-01 | Disposition: A | Discharge status: Home / Self Care | Payer: BC Managed Care – PPO | Attending: Emergency Medicine | Admitting: Emergency Medicine

## 2011-12-01 DIAGNOSIS — S0003XA Contusion of scalp, initial encounter: Secondary | ICD-10-CM | POA: Insufficient documentation

## 2011-12-01 DIAGNOSIS — S0280XA Fracture of other specified skull and facial bones, unspecified side, initial encounter for closed fracture: Secondary | ICD-10-CM | POA: Insufficient documentation

## 2011-12-01 DIAGNOSIS — S0181XA Laceration without foreign body of other part of head, initial encounter: Secondary | ICD-10-CM

## 2011-12-01 DIAGNOSIS — W19XXXA Unspecified fall, initial encounter: Secondary | ICD-10-CM | POA: Insufficient documentation

## 2011-12-01 DIAGNOSIS — S01119A Laceration without foreign body of unspecified eyelid and periocular area, initial encounter: Secondary | ICD-10-CM | POA: Insufficient documentation

## 2011-12-01 DIAGNOSIS — R220 Localized swelling, mass and lump, head: Secondary | ICD-10-CM | POA: Insufficient documentation

## 2011-12-01 DIAGNOSIS — S0292XA Unspecified fracture of facial bones, initial encounter for closed fracture: Secondary | ICD-10-CM

## 2011-12-01 DIAGNOSIS — S0285XA Fracture of orbit, unspecified, initial encounter for closed fracture: Secondary | ICD-10-CM

## 2011-12-01 DIAGNOSIS — R221 Localized swelling, mass and lump, neck: Secondary | ICD-10-CM | POA: Insufficient documentation

## 2011-12-01 DIAGNOSIS — S0083XA Contusion of other part of head, initial encounter: Secondary | ICD-10-CM

## 2011-12-01 HISTORY — DX: Coagulation defect, unspecified: D68.9

## 2011-12-01 LAB — RAPID URINE DRUG SCREEN, HOSP PERFORMED
Amphetamines: NOT DETECTED
Barbiturates: NOT DETECTED
Opiates: NOT DETECTED
Tetrahydrocannabinol: NOT DETECTED

## 2011-12-01 LAB — BASIC METABOLIC PANEL
BUN: 8 mg/dL (ref 6–23)
CO2: 26 mEq/L (ref 19–32)
Calcium: 9.5 mg/dL (ref 8.4–10.5)
Creatinine, Ser: 0.64 mg/dL (ref 0.50–1.10)
GFR calc non Af Amer: >90 mL/min (ref >90)
Glucose, Bld: 99 mg/dL (ref 70–99)

## 2011-12-01 LAB — ACETAMINOPHEN LEVEL: Acetaminophen (Tylenol), Serum: <15 ug/mL (ref 10–30)

## 2011-12-01 LAB — SALICYLATE LEVEL: Salicylate Lvl: <2 mg/dL — ABNORMAL LOW (ref 2.8–20.0)

## 2011-12-01 MED ORDER — IBUPROFEN 600 MG PO TABS: 600.0000 mg | ORAL_TABLET | Freq: Four times a day (QID) | ORAL | Status: AC | PRN

## 2011-12-01 MED ORDER — SODIUM CHLORIDE 0.9 % IV SOLN
INTRAVENOUS | Status: DC
  Administered 2011-12-01: 19:00:00 via INTRAVENOUS

## 2011-12-01 MED ORDER — CEPHALEXIN 500 MG PO CAPS: 500.0000 mg | ORAL_CAPSULE | Freq: Four times a day (QID) | ORAL | Status: AC

## 2011-12-01 MED ORDER — TETANUS-DIPHTH-ACELL PERTUSSIS 5-2.5-18.5 LF-MCG/0.5 IM SUSP
0.5000 mL | Freq: Once | INTRAMUSCULAR | Status: AC
  Administered 2011-12-01: 0.5 mL via INTRAMUSCULAR
  Filled 2011-12-01: qty 0.5

## 2011-12-01 MED ORDER — ONDANSETRON HCL 4 MG/2ML IJ SOLN
4.0000 mg | Freq: Once | INTRAMUSCULAR | Status: AC
  Administered 2011-12-01: 4 mg via INTRAVENOUS
  Filled 2011-12-01: qty 2

## 2011-12-01 NOTE — ED Notes (Signed)
PT and family states understanding of discharge instructions

## 2011-12-01 NOTE — Discharge Instructions (Signed)
Keep head elevated as much as possible for the next 3-4 days, even while sleeping, to help with pain/swelling. Avoid blowing nose or sneezing for the next few weeks.  Take motrin as need for pain. Icepacks/cold to sore area intermittently. Take antibiotic as prescribed.  Keep wound very clean. Have sutures removed in 5-7 days.  GIven history and recent fall, avoid medications that cause drowsiness. For facial fractures, follow up with maxillofacial specialist (oral/maxillofacial surgeon) in coming week - see referral - call office tomorrow morning to arrange that follow up appointment.  Return to ER if worse, worsening/severe pain, new symptoms, persistent vomiting, change in vision, other concern.     Orbital Floor Fracture, Blowout The eye sits in the bony structure of the skull called the orbit. The upper and outside walls of the orbit are very thick and strong. These walls protect the eye if the head is struck from the top or side of the eye. However, the inside wall near the nose and the orbit floor are very thin and weak. The bony floor of the orbit also acts as the roof of the air-filled space (sinus) below the orbit. If the eye receives a direct blow from the front, all the tissues around the eye are briefly pressed together. This makes the orbital wall pressure very high. Since the weakest walls tend to give way first, the inside wall or the orbit floor may break. If the floor fractures, the tissues around the eye, including the muscle that is used to make the eye look down, may become trapped within the fracture as the floor of the orbit "blows out" into the sinus below.  CAUSES  Orbital floor fractures are caused by direct (blunt) trauma to the region of the eye. SYMPTOMS  Assuming that there has been no injury to the eye itself, symptoms can include:  Puffiness (swelling) and bruising around the eye area (black eye).   A gurgling sound when pressure is placed on the eye area. This  sound comes from air that has escaped from the sinus into the space around the eye (orbital emphysema).   Seeing two of everything - one object being higher than the other (vertical diplopia). This is the result of the muscle that moves the eye down being trapped within the fracture. Since it cannot relax, the eye is being held in a downward position relative to the other eye and cannot look up. Vertical diplopia from an orbital floor fracture is worse when looking up.   Pain around the eye when looking up.   One eye looks sunken compared to the other eye (enophthalmos).   Numbness of the cheek and upper gum on the same side of the face with the floor fracture. This is a result of nerve injury to these areas. This nerve runs in a groove along the bone of the orbital floor on its way to the cheek and upper gums.  DIAGNOSIS  The diagnosis of an orbital floor fracture is suspected during an eye exam by an ophthalmologist. It is confirmed by X-rays or CT scan of the eye region. TREATMENT   Orbital floor fractures are not usually treated until all of the swelling around the eye has gone away. This may take 1 or 2 weeks. Once the swelling has gone down, an ophthalmologist will see if if the muscle below the eye is still trapped within the fracture.   If there is no sign of a trapped muscle or vertical diplopia, treatment is not  necessary.   If there is double vision only when looking up, a decision may be made to not do anything since most people do not spend a lot of time looking up. This may depend on the person's profession. For instance, a Nutritional therapist or electrician may spend a large part of their day looking up and would therefore need treatment.   If there is persistent vertical double vision even when looking straight ahead, the ophthalmologist may try to free the muscle in the office. If this is unsuccessful, surgery is often needed.  SEEK IMMEDIATE MEDICAL CARE IF:  You have had a blow to the  region of your eyes and have:  A drop in vision in either eye.   Swelling and bruising around either eye.   One eye seems to be "sunken" compared to the other.   You see two of everything with both eyes open when looking in any direction.   The two images get further apart when looking in a certain direction - especially up.   You have numbness of the cheek and upper gums on the side of the injury.   You develop an unexplained oral temperature over 102 F (38.9 C), or as your caregiver suggests.  Document Released: 01/29/2001 Document Revised: 07/25/2011 Document Reviewed: 11/30/2007 [facility] Patient Information 2012 [place], [place].       Facial Fracture A facial fracture is a break in one of the bones of your face. HOME CARE INSTRUCTIONS   Protect the injured part of your face until it is healed.   Do not participate in activities which give chance for re-injury until your doctor approves.   Gently wash and dry your face.   Wear head and facial protection while riding a bicycle, motorcycle, or snowmobile.  SEEK MEDICAL CARE IF:   An oral temperature above 102 F (38.9 C) develops.   You have severe headaches or notice changes in your vision.   You have new numbness or tingling in your face.   You develop nausea (feeling sick to your stomach), vomiting or a stiff neck.  SEEK IMMEDIATE MEDICAL CARE IF:   You develop difficulty seeing or experience double vision.   You become dizzy, lightheaded, or faint.   You develop trouble speaking, breathing, or swallowing.   You have a watery discharge from your nose or ear.  MAKE SURE YOU:   Understand these instructions.   Will watch your condition.   Will get help right away if you are not doing well or get worse.  Document Released: 08/05/2005 Document Revised: 07/25/2011 Document Reviewed: 03/24/2008 [facility] Patient Information 2012 [place], [place].     Facial and Scalp Contusions You have a contusion  (bruise) on your face or scalp. Injuries around the face and head generally cause a lot of swelling, especially around the eyes. This is because the blood supply to this area is good and tissues are loose. Swelling from a contusion is usually better in 2-3 days. It may take a week or longer for a "black eye" to clear up completely. HOME CARE INSTRUCTIONS   Apply ice packs to the injured area for about 15 to 20 minutes, 3 to 4 times a day, for the first couple days. This helps keep swelling down.   Use mild pain medicine as needed or instructed by your caregiver.   You may have a mild headache, slight dizziness, nausea, and weakness for a few days. This usually clears up with bed rest and mild pain medications.  Contact your caregiver if you are concerned about facial defects or have any difficulty with your bite or develop pain with chewing.  SEEK IMMEDIATE MEDICAL CARE IF:  You develop severe pain or a headache, unrelieved by medication.   You develop unusual sleepiness, confusion, personality changes, or vomiting.   You have a persistent nosebleed, double or blurred vision, or drainage from the nose or ear.   You have difficulty walking or using your arms or legs.  MAKE SURE YOU:   Understand these instructions.   Will watch your condition.   Will get help right away if you are not doing well or get worse.  Document Released: 09/12/2004 Document Revised: 07/25/2011 Document Reviewed: 06/21/2011 [facility] Patient Information 2012 [place], [place].     Laceration Care, Adult A laceration is a cut or lesion that goes through all layers of the skin and into the tissue just beneath the skin. TREATMENT  Some lacerations may not require closure. Some lacerations may not be able to be closed due to an increased risk of infection. It is important to see your caregiver as soon as possible after an injury to minimize the risk of infection and maximize the opportunity for successful  closure. If closure is appropriate, pain medicines may be given, if needed. The wound will be cleaned to help prevent infection. Your caregiver will use stitches (sutures), staples, wound glue (adhesive), or skin adhesive strips to repair the laceration. These tools bring the skin edges together to allow for faster healing and a better cosmetic outcome. However, all wounds will heal with a scar. Once the wound has healed, scarring can be minimized by covering the wound with sunscreen during the day for 1 full year. HOME CARE INSTRUCTIONS  For sutures or staples:  Keep the wound clean and dry.   If you were given a bandage (dressing), you should change it at least once a day. Also, change the dressing if it becomes wet or dirty, or as directed by your caregiver.   Wash the wound with soap and water 2 times a day. Rinse the wound off with water to remove all soap. Pat the wound dry with a clean towel.   After cleaning, apply a thin layer of the antibiotic ointment as recommended by your caregiver. This will help prevent infection and keep the dressing from sticking.   You may shower as usual after the first 24 hours. Do not soak the wound in water until the sutures are removed.   Only take over-the-counter or prescription medicines for pain, discomfort, or fever as directed by your caregiver.   Get your sutures or staples removed as directed by your caregiver.  For skin adhesive strips:  Keep the wound clean and dry.   Do not get the skin adhesive strips wet. You may bathe carefully, using caution to keep the wound dry.   If the wound gets wet, pat it dry with a clean towel.   Skin adhesive strips will fall off on their own. You may trim the strips as the wound heals. Do not remove skin adhesive strips that are still stuck to the wound. They will fall off in time.  For wound adhesive:  You may briefly wet your wound in the shower or bath. Do not soak or scrub the wound. Do not swim. Avoid  periods of heavy perspiration until the skin adhesive has fallen off on its own. After showering or bathing, gently pat the wound dry with a clean towel.  Do not apply liquid medicine, cream medicine, or ointment medicine to your wound while the skin adhesive is in place. This may loosen the film before your wound is healed.   If a dressing is placed over the wound, be careful not to apply tape directly over the skin adhesive. This may cause the adhesive to be pulled off before the wound is healed.   Avoid prolonged exposure to sunlight or tanning lamps while the skin adhesive is in place. Exposure to ultraviolet light in the first year will darken the scar.   The skin adhesive will usually remain in place for 5 to 10 days, then naturally fall off the skin. Do not pick at the adhesive film.  You may need a tetanus shot if:  You cannot remember when you had your last tetanus shot.   You have never had a tetanus shot.  If you get a tetanus shot, your arm may swell, get red, and feel warm to the touch. This is common and not a problem. If you need a tetanus shot and you choose not to have one, there is a rare chance of getting tetanus. Sickness from tetanus can be serious. SEEK MEDICAL CARE IF:   You have redness, swelling, or increasing pain in the wound.   You see a red line that goes away from the wound.   You have yellowish-white fluid (pus) coming from the wound.   You have a fever.   You notice a bad smell coming from the wound or dressing.   Your wound breaks open before or after sutures have been removed.   You notice something coming out of the wound such as wood or glass.   Your wound is on your hand or foot and you cannot move a finger or toe.  SEEK IMMEDIATE MEDICAL CARE IF:   Your pain is not controlled with prescribed medicine.   You have severe swelling around the wound causing pain and numbness or a change in color in your arm, hand, leg, or foot.   Your wound  splits open and starts bleeding.   You have worsening numbness, weakness, or loss of function of any joint around or beyond the wound.   You develop painful lumps near the wound or on the skin anywhere on your body.  MAKE SURE YOU:   Understand these instructions.   Will watch your condition.   Will get help right away if you are not doing well or get worse.  Document Released: 08/05/2005 Document Revised: 07/25/2011 Document Reviewed: 01/29/2011 [facility] Patient Information 2012 [place], [place].     Cryotherapy Cryotherapy means treatment with cold. Ice or gel packs can be used to reduce both pain and swelling. Ice is the most helpful within the first 24 to 48 hours after an injury or flareup from overusing a muscle or joint. Sprains, strains, spasms, burning pain, shooting pain, and aches can all be eased with ice. Ice can also be used when recovering from surgery. Ice is effective, has very few side effects, and is safe for most people to use. PRECAUTIONS  Ice is not a safe treatment option for people with:  Raynaud's phenomenon. This is a condition affecting small blood vessels in the extremities. Exposure to cold may cause your problems to return.   Cold hypersensitivity. There are many forms of cold hypersensitivity, including:   Cold urticaria. Red, itchy hives appear on the skin when the tissues begin to warm after being iced.   Cold erythema. This  is a red, itchy rash caused by exposure to cold.   Cold hemoglobinuria. Red blood cells break down when the tissues begin to warm after being iced. The hemoglobin that carry oxygen are passed into the urine because they cannot combine with blood proteins fast enough.   Numbness or altered sensitivity in the area being iced.  If you have any of the following conditions, do not use ice until you have discussed cryotherapy with your caregiver:  Heart conditions, such as arrhythmia, angina, or chronic heart disease.   High blood  pressure.   Healing wounds or open skin in the area being iced.   Current infections.   Rheumatoid arthritis.   Poor circulation.   Diabetes.  Ice slows the blood flow in the region it is applied. This is beneficial when trying to stop inflamed tissues from spreading irritating chemicals to surrounding tissues. However, if you expose your skin to cold temperatures for too long or without the proper protection, you can damage your skin or nerves. Watch for signs of skin damage due to cold. HOME CARE INSTRUCTIONS Follow these tips to use ice and cold packs safely.  Place a dry or damp towel between the ice and skin. A damp towel will cool the skin more quickly, so you may need to shorten the time that the ice is used.   For a more rapid response, add gentle compression to the ice.   Ice for no more than 10 to 20 minutes at a time. The bonier the area you are icing, the less time it will take to get the benefits of ice.   Check your skin after 5 minutes to make sure there are no signs of a poor response to cold or skin damage.   Rest 20 minutes or more in between uses.   Once your skin is numb, you can end your treatment. You can test numbness by very lightly touching your skin. The touch should be so light that you do not see the skin dimple from the pressure of your fingertip. When using ice, most people will feel these normal sensations in this order: cold, burning, aching, and numbness.   Do not use ice on someone who cannot communicate their responses to pain, such as small children or people with dementia.  HOW TO MAKE AN ICE PACK Ice packs are the most common way to use ice therapy. Other methods include ice massage, ice baths, and cryo-sprays. Muscle creams that cause a cold, tingly feeling do not offer the same benefits that ice offers and should not be used as a substitute unless recommended by your caregiver. To make an ice pack, do one of the following:  Place crushed ice or  a bag of frozen vegetables in a sealable plastic bag. Squeeze out the excess air. Place this bag inside another plastic bag. Slide the bag into a pillowcase or place a damp towel between your skin and the bag.   Mix 3 parts water with 1 part rubbing alcohol. Freeze the mixture in a sealable plastic bag. When you remove the mixture from the freezer, it will be slushy. Squeeze out the excess air. Place this bag inside another plastic bag. Slide the bag into a pillowcase or place a damp towel between your skin and the bag.  SEEK MEDICAL CARE IF:  You develop white spots on your skin. This may give the skin a blotchy (mottled) appearance.   Your skin turns blue or pale.   Your skin  becomes waxy or hard.   Your swelling gets worse.  MAKE SURE YOU:   Understand these instructions.   Will watch your condition.   Will get help right away if you are not doing well or get worse.  Document Released: 04/01/2011 Document Revised: 07/25/2011 Document Reviewed: 04/01/2011 [facility] Patient Information 2012 [place], [place].     Home Safety and Preventing Falls Falls are a leading cause of injury and while they affect all age groups, falls have greater short-term and long-term impact on older age groups. However, falls should not be a part of life or aging. It is possible for individuals and their families to use preventive measures to significantly decrease the likelihood that anyone, especially an older adult, will fall. There are many simple measures which can make your home safer with respect to preventing falls. The following actions can help reduce falls among all members of your family and are especially important as you age, when your balance, lower limb strength, coordination, and eyesight may be declining. The use of preventive measures will help to reduce you and your family's risk of falls and serious medical consequences. OUTDOORS  Repair cracks and edges of walkways and driveways.    Remove high doorway thresholds and trim shrubbery on the main path into your home.   Ensure there is good outside lighting at main entrances and along main walkways.   Clear walkways of tools, rocks, debris, and clutter.   Check that handrails are not broken and are securely fastened. Both sides of steps should have handrails.   In the garage, be attentive to and clean up grease or oil spills on the cement. This can make the surface extremely slippery.   In winter, have leaves, snow, and ice cleared regularly.   Use sand or salt on walkways during winter months.  BATHROOM  Install grab bars by the toilet and in the tub and shower.   Use non-skid mats or decals in the tub or shower.   If unable to easily stand unsupported while showering, place a plastic non slip stool in the shower to sit on when needed.   Install night lights.   Keep floors dry and clean up all water on the floor immediately.   Remove soap buildup in tub or shower on a regular basis.   Secure bath mats with non-slip, double-sided rug tape.   Remove tripping hazards from the floors.  BEDROOMS  Install night lights.   Do not use oversized bedding.   Make sure a bedside light is easy to reach.   Keep a telephone by your bedside.   Make sure that you can get in and out of your bed easily.   Have a firm chair, with side arms, to use for getting dressed.   Remove clutter from around closets.   Store clothing, bed coverings, and other household items where you can reach them comfortably.   Remove tripping hazards from the floor.  LIVING AREAS AND STAIRWAYS  Turn on lights to avoid having to walk through dark areas.   Keep lighting uniform in each room. Place brighter lightbulbs in darker areas, including stairways.   Replace lightbulbs that burn out in stairways immediately.   Arrange furniture to provide for clear pathways.   Keep furniture in the same place.   Eliminate or tape down  electrical cables in high traffic areas.   Place handrails on both sides of stairways. Use handrails when going up or down stairs.   Most falls  occur on the top or bottom 3 steps.   Fix any loose handrails. Make sure handrails on both sides of the stairways are as long as the stairs.   Remove all walkway obstacles.   Coil or tape electrical cords off to the side of walking areas and out of the way. If using many extension cords, have an electrician put in a new wall outlet to reduce or eliminate them.   Make sure spills are cleaned up quickly and allow time for drying before walking on freshly cleaned floors.   Firmly attach carpet with non-skid or two-sided tape.   Keep frequently used items within easy reach.   Remove tripping hazards such as throw rugs and clutter in walkways. Never leave objects on stairs.   Get rid of throw rugs elsewhere if possible.   Eliminate uneven floor surfaces.   Make sure couches and chairs are easy to get into and out of.   Check carpeting to make sure it is firmly attached along stairs.   Make repairs to worn or loose carpet promptly.   Select a carpet pattern that does not visually hide the edge of steps.   Avoid placing throw rugs or scatter rugs at the top or bottom of stairways, or properly secure with carpet tape to prevent slippage.   Have an electrician put in a light switch at the top and bottom of the stairs.   Get light switches that glow.   Avoid the following practices: hurrying, inattention, obscured vision, carrying large loads, and wearing slip-on shoes.   Be aware of all pets.  KITCHEN  Place items that are used frequently, such as dishes and food, within easy reach.   Keep handles on pots and pans toward the center of the stove. Use back burners when possible.   Make sure spills are cleaned up quickly and allow time for drying.   Avoid walking on wet floors.   Avoid hot utensils and knives.   Position shelves so  they are not too high or low.   Place commonly used objects within easy reach.   If necessary, use a sturdy step stool with a grab bar when reaching.   Make sure electrical cables are out of the way.   Do not use floor polish or wax that makes floors slippery.  OTHER HOME FALL PREVENTION STRATEGIES  Wear low heel or rubber sole shoes that are supportive and fit well.   Wear closed toe shoes.   Know and watch for side effects of medications. Have your caregiver or pharmacist look at all your medicines, even over-the-counter medicines. Some medicines can make you sleepy or dizzy.   Exercise regularly. Exercise makes you stronger and improves your balance and coordination.   Limit use of alcohol.   Use eyeglasses if necessary and keep them clean. Have your vision checked every year.   Organize your household in a manner that minimizes the need to walk distances when hurried, or go up and down stairs unnecessarily. For example, have a phone placed on at least each floor of your home. If possible, have a phone beside each sitting or lying area where you spend the most time at home. Keep emergency numbers posted at all phones.   Use non-skid floor wax.   When using a ladder, make sure:   The base is firm.   All ladder feet are on level ground.   The ladder is angled against the wall properly.   When climbing a ladder,  face the ladder and hold the ladder rungs firmly.   If reaching, always keep your hips and body weight centered between the rails.   When using a stepladder, make sure it is fully opened and both spreaders are firmly locked.   Do not climb a closed stepladder.   Avoid climbing beyond the second step from the top of a stepladder and the 4th rung from the top of an extension ladder.   Learn and use mobility aids as needed.   Change positions slowly. Arise slowly from sitting and lying positions. Sit on the edge of your bed before getting to your feet.   If you  have a history of falls, ask someone to add color or contrast paint or tape to grab bars and handrails in your home.   If you have a history of falls, ask someone to place contrasting color strips on first and last steps.   Install an electrical emergency response system if you need one, and know how to use it.   If you have a medical or other condition that causes you to have limited physical strength, it is important that you reach out to family and friends for occasional help.  FOR CHILDREN:  If young children are in the home, use safety gates. At the top of stairs use screw-mounted gates; use pressure-mounted gates for the bottom of the stairs and doorways between rooms.   Young children should be taught to descend stairs on their stomachs, feet first, and later using the handrail.   Keep drawers fully closed to prevent them from being climbed on or pulled out entirely.   Move chairs, cribs, beds and other furniture away from windows.   Consider installing window guards on windows ground floor and up, unless they are emergency fire exits. Make sure they have easy release mechanisms.   Consider installing special locks that only allow the window to be opened to a certain height.   Never rely on window screens to prevent falls.   Never leave babies alone on changing tables, beds or sofas. Use a changing table that has a restraining strap.   When a child can pull to a standing position, the crib mattress should be adjusted to its lowest position. There should be at least 26 inches between the top rails of the crib drop side and the mattress. Toys, bumper pads, and other objects that can be used as steps to climb out should be removed from the crib.   On bunk beds never allow a child under age 46 to sleep on the top bunk. For older children, if the upper bunk is not against a wall, use guard rails on both sides. No matter how old a child is, keep the guard rails in place on the top bunk  since children roll during sleep. Do not permit horseplay on bunks.   Grass and soil surfaces beneath backyard playground equipment should be replaced with hardwood chips, shredded wood mulch, sand, pea gravel, rubber, crushed stone, or another safer material at depths of at least 9 to 12 inches.   When riding bikes or using skates, skateboards, skis, or snowboards, require children to wear helmets. Look for those that have stickers stating that they meet or exceed safety standards.   Vertical posts or pickets in deck, balcony, and stairway railings should be no more than 3 1/2 inches apart if a young baby will have access to the area. The space between horizontal rails or bars, and between  the floor and the first horizontal rail or bar, should be no more than 3 1/2 inches.  Document Released: 07/26/2002 Document Revised: 07/25/2011 Document Reviewed: 05/25/2009 [facility] Patient Information 2012 [place], [place].

## 2011-12-01 NOTE — ED Notes (Signed)
Nauseated, edp made aware

## 2011-12-01 NOTE — ED Provider Notes (Signed)
History     CSN: [id number]  Arrival date & time 12/01/11  1605   First MD Initiated Contact with Patient 12/01/11 1628      Chief Complaint  Patient presents with  . Fall    (Consider location/radiation/quality/duration/timing/severity/associated sxs/prior treatment) Patient is a 46 y.o. female presenting with fall. The history is provided by the patient and a friend. The history is limited by the condition of the patient.  Fall  s/p fall at home this morning. Pt says takes [place] for sleep. Doesn't recall falling. ?brief loc. Pt v difficult historian - appears drowsy, altered - level 5 caveat. Unsure when last tetanus. pts ex-spouse arrives. 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 States when she does this she is very Energy manager, incoherent - states on many occasions much worse that today. Indicates this has resulted in prior falls and mvas. Pt arrrives w contusion lac right face, superior to right eye. Bleeding stopped pta. Pt denies other pain. Other than Remus Loffler, denies taking other meds.   Past Medical History  Diagnosis Date  . Migraine   . Blood clotting disorder     Past Surgical History  Procedure Date  . Abdominal hysterectomy     No family history on file.  History  Substance Use Topics  . Smoking status: Never Smoker   . Smokeless tobacco: Not on file  . Alcohol Use: No    OB History    Grav Para Term Preterm Abortions TAB SAB Ect Mult Living                  Review of Systems  Unable to perform ROS: Mental status change  level 5 caveat  Allergies  Acetaminophen  Home Medications  No current outpatient prescriptions on file.  BP 131/76  Pulse 84  Temp(Src) 98.1 F (36.7 C) (Oral)  Resp 20  SpO2 97%  Physical Exam  Nursing note and vitals reviewed. Constitutional: She appears well-developed and well-nourished. No distress.  HENT:  Mouth/Throat: Oropharynx is clear and moist.    Sts/contusion right face/periorbital area. 1 cm lac to eyelid/brown.   Eyes: Conjunctivae and EOM are normal. Pupils are equal, round, and reactive to light. No scleral icterus.  Neck: Neck supple. No tracheal deviation present.  Cardiovascular: Normal rate, regular rhythm, normal heart sounds and intact distal pulses.   Pulmonary/Chest: Effort normal and breath sounds normal. No respiratory distress.  Abdominal: Soft. Normal appearance and bowel sounds are normal. She exhibits no distension and no mass. There is no tenderness. There is no rebound and no guarding.  Musculoskeletal: Normal range of motion. She exhibits no edema and no tenderness.       Mild mid cervical tenderness. Remainder ctls spine exam non tender, aligned, no step off.  Good rom bil extremities without pain or focal bony tenderness. Healing scab to right arm from prior fall.   Neurological: She is alert.       Oriented to person, place. Appears drowsy, lethargic. Moves bil extremities purposefully with good strength.   Skin: Skin is warm and dry. No rash noted.  Psychiatric:       Drowsy, lethargic.     ED Course  Procedures (including critical care time)   Labs Reviewed  BASIC METABOLIC PANEL  ETHANOL  URINE RAPID DRUG SCREEN (HOSP PERFORMED)   Results for orders placed during the hospital encounter of 12/01/11  BASIC METABOLIC PANEL      Component Value Range  Sodium 136  135 - 145 (mEq/L)   Potassium 3.5  3.5 - 5.1 (mEq/L)   Chloride 100  96 - 112 (mEq/L)   CO2 26  19 - 32 (mEq/L)   Glucose, Bld 99  70 - 99 (mg/dL)   BUN 8  6 - 23 (mg/dL)   Creatinine, Ser 1.61  0.50 - 1.10 (mg/dL)   Calcium 9.5  8.4 - 09.6 (mg/dL)   GFR calc non Af Amer >90  >90 (mL/min)   GFR calc Af Amer >90  >90 (mL/min)  ETHANOL      Component Value Range   Alcohol, Ethyl (B) <11  0 - 11 (mg/dL)  URINE RAPID DRUG SCREEN (HOSP PERFORMED)      Component Value Range   Opiates NONE DETECTED  NONE DETECTED    Cocaine NONE DETECTED   NONE DETECTED    Benzodiazepines NONE DETECTED  NONE DETECTED    Amphetamines NONE DETECTED  NONE DETECTED    Tetrahydrocannabinol NONE DETECTED  NONE DETECTED    Barbiturates NONE DETECTED  NONE DETECTED   ACETAMINOPHEN LEVEL      Component Value Range   Acetaminophen (Tylenol), Serum <15.0  10 - 30 (ug/mL)  SALICYLATE LEVEL      Component Value Range   Salicylate Lvl <2.0 (*) 2.8 - 20.0 (mg/dL)   Ct Head Wo Contrast  12/01/2011  *RADIOLOGY REPORT*  Clinical Data:  Fall, laceration.  Altered mental status  CT HEAD WITHOUT CONTRAST CT MAXILLOFACIAL WITHOUT CONTRAST CT CERVICAL SPINE WITHOUT CONTRAST  Technique:  Multidetector CT imaging of the head, cervical spine, and maxillofacial structures were performed using the standard protocol without intravenous contrast. Multiplanar CT image reconstructions of the cervical spine and maxillofacial structures were also generated.  Comparison:  Head CT 11/24/2005  CT HEAD  Findings: Exam is degraded by patient head motion particularly in the posterior fossa.  No intracranial hemorrhage.  No parenchymal contusion.  No midline shift mass effect.  Basilar cisterns are patent.  No evidence of skull base fracture.  Right orbital injury described in the face section.  IMPRESSION: No intracranial trauma.  CT MAXILLOFACIAL  Findings:  There is a blowout fracture of the right orbit. There is a depressed right orbital floor fracture with  bone fragments in the maxillary sinus.  There is fracture of the posterior lateral wall of the right maxillary sinus.  Additional fracture of the anterior aspect of the right maxillary sinus, inferior to the orbit.  There is preseptal swelling on the right eight.  Intraconal contents appear normal.  Cannot exclude entrapment of the inferior rectus muscle.  The medial and lateral wall and orbits appear normal.  The zygomatic arch is normal.  The left orbit is normal.  Left zygomatic arch is normal. Pterygoid plates are normal.  There is  blood within the right maxillary sinus.  The annular condyles are located.  No evidence of mandibular fracture.  IMPRESSION:  1.  Blow out fracture of the right orbit with depressed fracture of the orbital floor and fractures of the anterior wall and posterolateral wall of the right maxillary sinus. 2.  Intraconal contents appear normal but cannot exclude entrapment of the inferior rectus muscle.  Recommend clinical correlation. 3.  Preseptal swelling on the right.  CT CERVICAL SPINE  Findings:   No prevertebral soft tissue swelling.  Normal alignment cervical vertebral bodies.  Normal facet articulation.  Normal craniocervical junction.  Evidence epidural paraspinal hematoma.  IMPRESSION: No evidence of cervical spine fracture.  Original Report Authenticated By: Genevive Bi, M.D.   Ct Cervical Spine Wo Contrast  12/01/2011  *RADIOLOGY REPORT*  Clinical Data:  Fall, laceration.  Altered mental status  CT HEAD WITHOUT CONTRAST CT MAXILLOFACIAL WITHOUT CONTRAST CT CERVICAL SPINE WITHOUT CONTRAST  Technique:  Multidetector CT imaging of the head, cervical spine, and maxillofacial structures were performed using the standard protocol without intravenous contrast. Multiplanar CT image reconstructions of the cervical spine and maxillofacial structures were also generated.  Comparison:  Head CT 11/24/2005  CT HEAD  Findings: Exam is degraded by patient head motion particularly in the posterior fossa.  No intracranial hemorrhage.  No parenchymal contusion.  No midline shift mass effect.  Basilar cisterns are patent.  No evidence of skull base fracture.  Right orbital injury described in the face section.  IMPRESSION: No intracranial trauma.  CT MAXILLOFACIAL  Findings:  There is a blowout fracture of the right orbit. There is a depressed right orbital floor fracture with  bone fragments in the maxillary sinus.  There is fracture of the posterior lateral wall of the right maxillary sinus.  Additional fracture of the  anterior aspect of the right maxillary sinus, inferior to the orbit.  There is preseptal swelling on the right eight.  Intraconal contents appear normal.  Cannot exclude entrapment of the inferior rectus muscle.  The medial and lateral wall and orbits appear normal.  The zygomatic arch is normal.  The left orbit is normal.  Left zygomatic arch is normal. Pterygoid plates are normal.  There is blood within the right maxillary sinus.  The annular condyles are located.  No evidence of mandibular fracture.  IMPRESSION:  1.  Blow out fracture of the right orbit with depressed fracture of the orbital floor and fractures of the anterior wall and posterolateral wall of the right maxillary sinus. 2.  Intraconal contents appear normal but cannot exclude entrapment of the inferior rectus muscle.  Recommend clinical correlation. 3.  Preseptal swelling on the right.  CT CERVICAL SPINE  Findings:   No prevertebral soft tissue swelling.  Normal alignment cervical vertebral bodies.  Normal facet articulation.  Normal craniocervical junction.  Evidence epidural paraspinal hematoma.  IMPRESSION: No evidence of cervical spine fracture.  Original Report Authenticated By: Genevive Bi, M.D.   Ct Maxillofacial Wo Cm  12/01/2011  *RADIOLOGY REPORT*  Clinical Data:  Fall, laceration.  Altered mental status  CT HEAD WITHOUT CONTRAST CT MAXILLOFACIAL WITHOUT CONTRAST CT CERVICAL SPINE WITHOUT CONTRAST  Technique:  Multidetector CT imaging of the head, cervical spine, and maxillofacial structures were performed using the standard protocol without intravenous contrast. Multiplanar CT image reconstructions of the cervical spine and maxillofacial structures were also generated.  Comparison:  Head CT 11/24/2005  CT HEAD  Findings: Exam is degraded by patient head motion particularly in the posterior fossa.  No intracranial hemorrhage.  No parenchymal contusion.  No midline shift mass effect.  Basilar cisterns are patent.  No evidence of  skull base fracture.  Right orbital injury described in the face section.  IMPRESSION: No intracranial trauma.  CT MAXILLOFACIAL  Findings:  There is a blowout fracture of the right orbit. There is a depressed right orbital floor fracture with  bone fragments in the maxillary sinus.  There is fracture of the posterior lateral wall of the right maxillary sinus.  Additional fracture of the anterior aspect of the right maxillary sinus, inferior to the orbit.  There is preseptal swelling on the right eight.  Intraconal contents appear normal.  Cannot exclude entrapment of the inferior rectus muscle.  The medial and lateral wall and orbits appear normal.  The zygomatic arch is normal.  The left orbit is normal.  Left zygomatic arch is normal. Pterygoid plates are normal.  There is blood within the right maxillary sinus.  The annular condyles are located.  No evidence of mandibular fracture.  IMPRESSION:  1.  Blow out fracture of the right orbit with depressed fracture of the orbital floor and fractures of the anterior wall and posterolateral wall of the right maxillary sinus. 2.  Intraconal contents appear normal but cannot exclude entrapment of the inferior rectus muscle.  Recommend clinical correlation. 3.  Preseptal swelling on the right.  CT CERVICAL SPINE  Findings:   No prevertebral soft tissue swelling.  Normal alignment cervical vertebral bodies.  Normal facet articulation.  Normal craniocervical junction.  Evidence epidural paraspinal hematoma.  IMPRESSION: No evidence of cervical spine fracture.  Original Report Authenticated By: Genevive Bi, M.D.       MDM  Labs. Ct.   LACERATION REPAIR Performed by: Suzi Roots Authorized by: Suzi Roots Consent: Verbal consent obtained. Risks and benefits: risks, benefits and alternatives were discussed Consent given by: patient Patient identity confirmed: provided demographic data Prepped and Draped in normal sterile fashion Wound  explored  Laceration Location: right eyelid  Laceration Length: 1cm  No Foreign Bodies seen or palpated  Anesthesia: local infiltration  Local anesthetic: lidocaine 2% wo epinephrine  Anesthetic total: <1 ml  Irrigation method: syringe Amount of cleaning: standard  Skin closure:  6-0 prolene  Number of sutures: 2  Technique: simple interrupted  Patient tolerance: Patient tolerated the procedure well with no immediate complications.      Recheck spine non tender. Recheck abd soft nt.    Pt awake and alert.  Maxillofacial on call paged re facial/orbital fracture.  Recheck eomi. No hyphema.    Discussed patient w maxillofacial on call, Dr Barbette Merino, including radiologist impression of ct mf as written incl depressed blowout/orbital floor, and other facial/orbital fractures.  He states have follow up in office in coming week.   Recheck pt fully awake and alert. States had taken an [place] earlier , prior to getting up and falling. Denies other substance/medication abuse. Drug screen neg.  Given hx, pt/spouse request no narcotic or meds prone to abuse to be given.  w lac, facial bone fxs will rx abx, stressed elevated head, cold packs, avoid blowing nose/sneezing, and maxillofacial follow up this week.      Suzi Roots, MD 12/01/11 [phone]
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
## 2012-04-21 ENCOUNTER — Other Ambulatory Visit: Payer: Self-pay | Admitting: Family Medicine

## 2012-04-21 DIAGNOSIS — Z9882 Breast implant status: Secondary | ICD-10-CM

## 2012-04-21 DIAGNOSIS — N644 Mastodynia: Secondary | ICD-10-CM

## 2012-04-22 ENCOUNTER — Ambulatory Visit
Admission: RE | Admit: 2012-04-22 | Discharge: 2012-04-22 | Disposition: A | Discharge status: Home / Self Care | Payer: BC Managed Care – PPO | Source: Ambulatory Visit | Attending: Family Medicine | Admitting: Family Medicine

## 2012-04-22 DIAGNOSIS — N644 Mastodynia: Secondary | ICD-10-CM

## 2012-04-22 DIAGNOSIS — Z9882 Breast implant status: Secondary | ICD-10-CM

## 2013-02-05 ENCOUNTER — Emergency Department (HOSPITAL_COMMUNITY): Payer: BC Managed Care – PPO

## 2013-02-05 ENCOUNTER — Encounter (HOSPITAL_COMMUNITY): Payer: Self-pay | Admitting: Emergency Medicine

## 2013-02-05 ENCOUNTER — Emergency Department (HOSPITAL_COMMUNITY)
Admission: EM | Admit: 2013-02-05 | Discharge: 2013-02-05 | Disposition: A | Discharge status: Home / Self Care | Payer: BC Managed Care – PPO | Attending: Emergency Medicine | Admitting: Emergency Medicine

## 2013-02-05 DIAGNOSIS — D689 Coagulation defect, unspecified: Secondary | ICD-10-CM | POA: Insufficient documentation

## 2013-02-05 DIAGNOSIS — G43909 Migraine, unspecified, not intractable, without status migrainosus: Secondary | ICD-10-CM | POA: Insufficient documentation

## 2013-02-05 DIAGNOSIS — Z79899 Other long term (current) drug therapy: Secondary | ICD-10-CM | POA: Insufficient documentation

## 2013-02-05 DIAGNOSIS — Z8679 Personal history of other diseases of the circulatory system: Secondary | ICD-10-CM | POA: Insufficient documentation

## 2013-02-05 DIAGNOSIS — Z7982 Long term (current) use of aspirin: Secondary | ICD-10-CM | POA: Insufficient documentation

## 2013-02-05 DIAGNOSIS — R4701 Aphasia: Secondary | ICD-10-CM | POA: Insufficient documentation

## 2013-02-05 DIAGNOSIS — M549 Dorsalgia, unspecified: Secondary | ICD-10-CM | POA: Insufficient documentation

## 2013-02-05 DIAGNOSIS — G8929 Other chronic pain: Secondary | ICD-10-CM | POA: Insufficient documentation

## 2013-02-05 HISTORY — DX: Dorsalgia, unspecified: M54.9

## 2013-02-05 HISTORY — DX: Other chronic pain: G89.29

## 2013-02-05 MED ORDER — PROMETHAZINE HCL 25 MG RE SUPP: 25.0000 mg | Freq: Four times a day (QID) | RECTAL | Status: DC | PRN

## 2013-02-05 MED ORDER — METOCLOPRAMIDE HCL 5 MG/ML IJ SOLN
10.0000 mg | Freq: Once | INTRAMUSCULAR | Status: AC
  Administered 2013-02-05: 10 mg via INTRAVENOUS
  Filled 2013-02-05: qty 2

## 2013-02-05 MED ORDER — SODIUM CHLORIDE 0.9 % IV SOLN
INTRAVENOUS | Status: DC
  Administered 2013-02-05: 13:00:00 via INTRAVENOUS

## 2013-02-05 MED ORDER — DIPHENHYDRAMINE HCL 50 MG/ML IJ SOLN
50.0000 mg | Freq: Once | INTRAMUSCULAR | Status: AC
  Administered 2013-02-05: 50 mg via INTRAVENOUS
  Filled 2013-02-05: qty 1

## 2013-02-05 NOTE — ED Notes (Addendum)
Patient reports dizziness, ataxia, aphasia for the past three days with a migraine headache beginning today. Patient describes headache as sharp, right sided pain that she noted when she woke up this am at 0230, having gone to sleep at 2300 yesterday. Speech is clear but delayed at times.

## 2013-02-05 NOTE — ED Provider Notes (Signed)
History     CSN: [id number]  Arrival date & time 02/05/13  1140   First MD Initiated Contact with Patient 02/05/13 1213      Chief Complaint  Patient presents with  . Aphasia  . Migraine     HPI Pt was seen at 1225.  Per pt, c/o gradual onset and persistence of constant acute flair of her chronic right sided migraine headache since last night.  Describes the headache as per her usual chronic migraine headache pain pattern as "dull," "throbbing," and "the same since since I was 47 years old."  Denies headache was sudden or maximal in onset or at any time.  Pt states her migraine headaches are usually accompanied by "difficulty speaking and walking," both of which have been present constantly for the past 3 days. Describes her speech difficulties as "slurred speech" and "can't find the right word to say."  Denies visual changes, no focal motor weakness, no tingling/numbness in extremities, no fevers, no neck pain, no rash. The symptoms have been associated with no other complaints. The patient has a significant history of similar symptoms previously, recently being evaluated for this complaint and multiple prior evals for same.        Past Medical History  Diagnosis Date  . Migraine     accompanied by speech difficulties "since I was 47 years old"  . Blood clotting disorder     anticardiolipin antibody IgM  . Chronic back pain     Past Surgical History  Procedure Laterality Date  . Abdominal hysterectomy      History  Substance Use Topics  . Smoking status: Never Smoker   . Smokeless tobacco: Not on file  . Alcohol Use: No      Review of Systems ROS: Statement: All systems negative except as marked or noted in the HPI; Constitutional: Negative for fever and chills. ; ; Eyes: Negative for eye pain, redness and discharge. ; ; ENMT: Negative for ear pain, hoarseness, nasal congestion, sinus pressure and sore throat. ; ; Cardiovascular: Negative for chest pain, palpitations,  diaphoresis, dyspnea and peripheral edema. ; ; Respiratory: Negative for cough, wheezing and stridor. ; ; Gastrointestinal: Negative for nausea, vomiting, diarrhea, abdominal pain, blood in stool, hematemesis, jaundice and rectal bleeding. . ; ; Genitourinary: Negative for dysuria, flank pain and hematuria. ; ; Musculoskeletal: Negative for back pain and neck pain. Negative for swelling and trauma.; ; Skin: Negative for pruritus, rash, abrasions, blisters, bruising and skin lesion.; ; Neuro: +migraine headache. Negative for lightheadedness and neck stiffness. Negative for weakness, altered level of consciousness , altered mental status, extremity weakness, paresthesias, involuntary movement, seizure and syncope.       Allergies  Penicillins and Acetaminophen  Home Medications   Current Outpatient Rx  Name  Route  Sig  Dispense  Refill  . aspirin 81 MG tablet   Oral   Take 81 mg by mouth every evening.         . B Complex-C (B-COMPLEX WITH VITAMIN C) tablet   Oral   Take 1 tablet by mouth every morning.         . gabapentin (NEURONTIN) 100 MG capsule   Oral   Take 100 mg by mouth 3 (three) times daily.         Marland Kitchen POTASSIUM PO   Oral   Take 1 tablet by mouth every morning.         Marland Kitchen ZOLMitriptan (ZOMIG) 2.5 MG tablet   Oral  Take 2.5 mg by mouth as needed for migraine.           BP 130/88  Pulse 103  Temp(Src) 98.8 F (37.1 C) (Oral)  Resp 20  SpO2 98%  Physical Exam 1230: Physical examination:  Nursing notes reviewed; Vital signs and O2 SAT reviewed;  Constitutional: Well developed, Well nourished, Well hydrated, In no acute distress; Head:  Normocephalic, atraumatic; Eyes: EOMI, PERRL, No scleral icterus; ENMT: TM's clear bilat. Mouth and pharynx normal, Mucous membranes moist; Neck: Supple, Full range of motion, No lymphadenopathy; Cardiovascular: Regular rate and rhythm, No murmur, rub, or gallop; Respiratory: Breath sounds clear & equal bilaterally, No rales,  rhonchi, wheezes.  Speaking full sentences with ease, Normal respiratory effort/excursion; Chest: Nontender, Movement normal; Abdomen: Soft, Nontender, Nondistended, Normal bowel sounds; Genitourinary: No CVA tenderness; Extremities: Pulses normal, No tenderness, No edema, No calf edema or asymmetry.; Neuro: AA&Ox3, Major CN grossly intact.  Strength 5/5 equal bilat UE's and LE's.  DTR 2/4 equal bilat UE's and LE's.  No gross sensory deficits.  Normal cerebellar testing bilat UE's (finger-nose) and LE's (heel-shin). Speech clear.  No facial droop.  No nystagmus..; Skin: Color normal, Warm, Dry.   ED Course  Procedures  MDM  MDM Reviewed: previous chart, nursing note and vitals Reviewed previous: MRI Interpretation: CT scan   Ct Head Wo Contrast 02/05/2013   *RADIOLOGY REPORT*  Clinical Data: Aphasia for 2 days.  Migraine headaches.  CT HEAD WITHOUT CONTRAST  Technique:  Contiguous axial images were obtained from the base of the skull through the vertex without contrast.  Comparison: Multiple priors, most recent CT 12/01/2011.  MRI head without contrast 12/17/2005.  Findings: No acute stroke or hemorrhage.  No mass lesion or hydrocephalus.  No extra-axial fluid.  Normal cerebral volume.  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  The calvarium is intact.  There is no acute sinus or mastoid disease.  The orbits are negative.  IMPRESSION: No visible acute stroke or hemorrhage.  Chronic left hemisphere changes as described appear stable.   Original Report Authenticated By: Davonna Belling, M.D.      1430:  Pt ambulatory around the ED with steady gait, easy resps. Neuro exam continues intact. Doubt CVA as cause for symptoms with normal CT-H after 3 days of constant neuro symptoms. Symptoms have resolved after treating pt for migraine headache.  Pt has climbed off the stretcher, gotten  herself dressed, states she feels "much better now" and wants to go home. Friend at bedside states pt "looks a lot better now."  Pt requesting phenergan suppositories prn. Dx and testing d/w pt and friend.  Questions answered.  Verb understanding, agreeable to d/c home with outpt f/u.      Laray Anger, DO 02/08/13 1427

## 2013-02-05 NOTE — ED Notes (Signed)
Per pt, having trouble walking, dizzy and having slurred speech-saw PCP and they thought it might be related to BP and/or migraine

## 2013-03-23 ENCOUNTER — Emergency Department (HOSPITAL_COMMUNITY)
Admission: EM | Admit: 2013-03-23 | Discharge: 2013-03-23 | Disposition: A | Discharge status: Home / Self Care | Payer: BC Managed Care – PPO | Attending: Emergency Medicine | Admitting: Emergency Medicine

## 2013-03-23 ENCOUNTER — Encounter (HOSPITAL_COMMUNITY): Payer: Self-pay | Admitting: *Deleted

## 2013-03-23 DIAGNOSIS — Z7982 Long term (current) use of aspirin: Secondary | ICD-10-CM | POA: Insufficient documentation

## 2013-03-23 DIAGNOSIS — Z79899 Other long term (current) drug therapy: Secondary | ICD-10-CM | POA: Insufficient documentation

## 2013-03-23 DIAGNOSIS — G43909 Migraine, unspecified, not intractable, without status migrainosus: Secondary | ICD-10-CM

## 2013-03-23 DIAGNOSIS — R41840 Attention and concentration deficit: Secondary | ICD-10-CM | POA: Insufficient documentation

## 2013-03-23 DIAGNOSIS — R42 Dizziness and giddiness: Secondary | ICD-10-CM | POA: Insufficient documentation

## 2013-03-23 DIAGNOSIS — M549 Dorsalgia, unspecified: Secondary | ICD-10-CM | POA: Insufficient documentation

## 2013-03-23 DIAGNOSIS — H53149 Visual discomfort, unspecified: Secondary | ICD-10-CM | POA: Insufficient documentation

## 2013-03-23 DIAGNOSIS — Z880 Allergy status to penicillin: Secondary | ICD-10-CM | POA: Insufficient documentation

## 2013-03-23 DIAGNOSIS — R4701 Aphasia: Secondary | ICD-10-CM | POA: Insufficient documentation

## 2013-03-23 DIAGNOSIS — G8929 Other chronic pain: Secondary | ICD-10-CM | POA: Insufficient documentation

## 2013-03-23 DIAGNOSIS — Z862 Personal history of diseases of the blood and blood-forming organs and certain disorders involving the immune mechanism: Secondary | ICD-10-CM | POA: Insufficient documentation

## 2013-03-23 MED ORDER — METOCLOPRAMIDE HCL 5 MG/ML IJ SOLN
10.0000 mg | Freq: Once | INTRAMUSCULAR | Status: AC
  Administered 2013-03-23: 10 mg via INTRAVENOUS
  Filled 2013-03-23: qty 2

## 2013-03-23 MED ORDER — SODIUM CHLORIDE 0.9 % IV BOLUS (SEPSIS)
1000.0000 mL | Freq: Once | INTRAVENOUS | Status: AC
  Administered 2013-03-23: 1000 mL via INTRAVENOUS

## 2013-03-23 MED ORDER — DEXAMETHASONE SODIUM PHOSPHATE 10 MG/ML IJ SOLN
10.0000 mg | Freq: Once | INTRAMUSCULAR | Status: AC
  Administered 2013-03-23: 10 mg via INTRAVENOUS
  Filled 2013-03-23: qty 1

## 2013-03-23 MED ORDER — HYDROMORPHONE HCL PF 1 MG/ML IJ SOLN
1.0000 mg | Freq: Once | INTRAMUSCULAR | Status: AC
  Administered 2013-03-23: 1 mg via INTRAVENOUS
  Filled 2013-03-23: qty 1

## 2013-03-23 NOTE — ED Notes (Signed)
Pt states started having a migraine yesterday then had symptoms of dizziness, slurred speech, trouble w/ memory and walking start after, pt states she has had this happen before when having a migraine. Pt states they called her doctor today and she couldn't been seen today. Pt states "I just can't think straight".

## 2013-03-23 NOTE — ED Provider Notes (Signed)
CSN: [id number]     Arrival date & time 03/23/13  1024 History     First MD Initiated Contact with Patient 03/23/13 1034     Chief Complaint  Patient presents with  . Migraine  . Dizziness  . Aphasia   (Consider location/radiation/quality/duration/timing/severity/associated sxs/prior Treatment) The history is provided by the patient and medical records.   Patient presents to the ED for gradual onset of migraine headache starting yesterday. When she awoke this morning the pain was still present.  Associated symptoms include phonophobia, dizziness, difficulty concentrating, and mildly unsteady gait due to dizziness.  The symptoms are NOT atypical for her usual migraine. Patient is on zolmatriptan PRN migraine which she has taken without relief.  Denies any photophobia, aura, numbness or paresthesias of extremities, confusion, or AMS.  No recent head trauma.  Pt on daily ASA, no other blood thinning agents.  VS stable on arrival.  Medical records reviewed-- pt presented with identical sx at last ED visit on 02/05/13.  CT head at that time was negative for acute findings.  Past Medical History  Diagnosis Date  . Migraine     accompanied by speech difficulties "since I was 47 years old"  . Blood clotting disorder     anticardiolipin antibody IgM  . Chronic back pain    Past Surgical History  Procedure Laterality Date  . Abdominal hysterectomy     No family history on file. History  Substance Use Topics  . Smoking status: Never Smoker   . Smokeless tobacco: Never Used  . Alcohol Use: No   OB History   Grav Para Term Preterm Abortions TAB SAB Ect Mult Living                 Review of Systems  Musculoskeletal: Positive for gait problem.  Neurological: Positive for dizziness and light-headedness.  All other systems reviewed and are negative.    Allergies  Penicillins and Acetaminophen  Home Medications   Current Outpatient Rx  Name  Route  Sig  Dispense  Refill  .  aspirin 81 MG tablet   Oral   Take 81 mg by mouth every evening.         . B Complex-C (B-COMPLEX WITH VITAMIN C) tablet   Oral   Take 1 tablet by mouth every morning.         . gabapentin (NEURONTIN) 100 MG capsule   Oral   Take 100 mg by mouth 3 (three) times daily.         Marland Kitchen POTASSIUM PO   Oral   Take 1 tablet by mouth every morning.         . promethazine (PHENERGAN) 25 MG suppository   Rectal   Place 1 suppository (25 mg total) rectally every 6 (six) hours as needed for nausea (or headache).   6 each   0   . ZOLMitriptan (ZOMIG) 2.5 MG tablet   Oral   Take 2.5 mg by mouth as needed for migraine.          BP 119/65  Pulse 68  Temp(Src) 98.1 F (36.7 C) (Oral)  Resp 18  SpO2 100%  Physical Exam  Nursing note and vitals reviewed. Constitutional: She is oriented to person, place, and time. She appears well-developed and well-nourished.  HENT:  Head: Normocephalic and atraumatic.  Mouth/Throat: Oropharynx is clear and moist.  Eyes: Conjunctivae and EOM are normal. Pupils are equal, round, and reactive to light.  Neck: Normal range of motion and  full passive range of motion without pain. Neck supple. No rigidity.  No meningeal signs  Cardiovascular: Normal rate, regular rhythm and normal heart sounds.   Pulmonary/Chest: Effort normal and breath sounds normal.  Musculoskeletal: Normal range of motion.  Neurological: She is alert and oriented to person, place, and time. She has normal strength. She displays no tremor. No cranial nerve deficit or sensory deficit. She displays no seizure activity.  CN grossly intact, moves all extremities appropriately without ataxia, equal grip strength upper extremities bilaterally, appropriate finger to nose bilaterally, no focal neuro deficits or facial droop appreciated  Skin: Skin is warm and dry.  Psychiatric: She has a normal mood and affect. Her speech is normal.  Speech is not slurred, speaking in full complete  sentences that are logical and goal oriented    ED Course   Procedures (including critical care time)  Labs Reviewed - No data to display No results found.  1. Migraine     MDM   Sx are identical to last ED visit, CT negative at that time.  I have questioned patient several times on her sx and she denies any mental status changes or confusion regarding her current status.  She is able to articulate her sx, speaking in sensical, complete sentences.  Plan to give migraine cocktail-- if no improvement or sx worsen, will obtain CT head.  12:34 PM Pt re-assessed.  States she feels completely normal again.  Has been talking with her friend, no longer dizzy, headache resolved.  Repeat neuro exam unchanged-- still no focal neuro deficits.  Doubt SAH, ICH, TIA, stroke, or meningitis.  Pt will be d/c with close PCP [facility] later this week as i feel her migraine medications may need adjustment.  Discussed this with pt and friend, they agreed.  Return precautions advised.  Garlon Hatchet, PA-C 03/23/13 [phone]

## 2013-03-25 NOTE — ED Provider Notes (Signed)
Medical screening examination/treatment/procedure(s) were performed by non-physician practitioner and as supervising physician I was immediately available for consultation/collaboration.  Candyce Churn, MD 03/25/13 [phone]

## 2013-08-18 ENCOUNTER — Emergency Department (INDEPENDENT_AMBULATORY_CARE_PROVIDER_SITE_OTHER)
Admission: EM | Admit: 2013-08-18 | Discharge: 2013-08-18 | Disposition: A | Discharge status: Home / Self Care | Payer: Self-pay | Source: Home / Self Care | Attending: Family Medicine | Admitting: Family Medicine

## 2013-08-18 ENCOUNTER — Encounter (HOSPITAL_COMMUNITY): Payer: Self-pay | Admitting: Emergency Medicine

## 2013-08-18 DIAGNOSIS — N390 Urinary tract infection, site not specified: Secondary | ICD-10-CM

## 2013-08-18 LAB — POCT URINALYSIS DIP (DEVICE)
Protein, ur: NEGATIVE mg/dL
Specific Gravity, Urine: 1.01 (ref 1.005–1.030)
Urobilinogen, UA: 2 mg/dL — ABNORMAL HIGH (ref 0.0–1.0)

## 2013-08-18 MED ORDER — CIPROFLOXACIN HCL 500 MG PO TABS: 500.0000 mg | ORAL_TABLET | Freq: Two times a day (BID) | ORAL | Status: DC

## 2013-08-18 MED ORDER — PROMETHAZINE HCL 25 MG PO TABS: 25.0000 mg | ORAL_TABLET | Freq: Four times a day (QID) | ORAL | Status: DC | PRN

## 2013-08-18 NOTE — ED Notes (Signed)
Pharmacy question: clarified the quantity--5 day course of medications per dr Denyse Amass

## 2013-08-18 NOTE — ED Provider Notes (Signed)
Kelsey Lang is a 47 y.o. female who presents to Urgent Care today for 4 days of dysuria urgency urinary frequency and low back pain. The symptoms are consistent with prior UTI. She has been taking AZO which seems to help. No nausea vomiting diarrhea fevers or chill. Patient feels well otherwise. Patient notes that she typically gets nauseated with urinary tract infection and requests Phenergan prophylactically. She is not nauseated now.  Past Medical History  Diagnosis Date  . Migraine     accompanied by speech difficulties "since I was 47 years old"  . Blood clotting disorder     anticardiolipin antibody IgM  . Chronic back pain    History  Substance Use Topics  . Smoking status: Never Smoker   . Smokeless tobacco: Never Used  . Alcohol Use: No   ROS as above Medications reviewed. No current facility-administered medications for this encounter.   Current Outpatient Prescriptions  Medication Sig Dispense Refill  . ALPRAZolam (XANAX) 0.5 MG tablet Take 0.5-1 mg by mouth daily as needed for sleep or anxiety.      . carisoprodol (SOMA) 350 MG tablet 350 mg 3 (three) times daily as needed for muscle spasms.       . ciprofloxacin (CIPRO) 500 MG tablet Take 1 tablet (500 mg total) by mouth 2 (two) times daily.  10 tablet  0  . gabapentin (NEURONTIN) 100 MG capsule Take 100 mg by mouth 3 (three) times daily.      Marland Kitchen HYDROmorphone (DILAUDID) 3 MG suppository Place 3 mg rectally every 6 (six) hours as needed for pain.      . metoprolol tartrate (LOPRESSOR) 25 MG tablet Take 25 mg by mouth 2 (two) times daily.      . promethazine (PHENERGAN) 25 MG tablet Take 25 mg by mouth every 6 (six) hours as needed for nausea.      . promethazine (PHENERGAN) 25 MG tablet Take 1 tablet (25 mg total) by mouth every 6 (six) hours as needed for nausea or vomiting.  20 tablet  0  . ZOLMitriptan (ZOMIG) 2.5 MG tablet Take 2.5 mg by mouth as needed for migraine.      Marland Kitchen zolpidem (AMBIEN) 10 MG tablet Take 10 mg  by mouth at bedtime.        Exam:  BP 137/80  Pulse 52  Temp(Src) 98.2 F (36.8 C) (Oral)  Resp 18  SpO2 99% Gen: Well NAD HEENT: EOMI,  MMM Lungs: Normal work of breathing. CTABL Heart: RRR no MRG Abd: NABS, Soft. NT, ND, no CV angle tenderness to percussion Exts: Non edematous BL  LE, warm and well perfused.   Results for orders placed during the hospital encounter of 08/18/13 (from the past 24 hour(s))  POCT URINALYSIS DIP (DEVICE)     Status: Abnormal   Collection Time    08/18/13  1:01 PM      Result Value Range   Glucose, UA 100 (*) NEGATIVE mg/dL   Bilirubin Urine NEGATIVE  NEGATIVE   Ketones, ur NEGATIVE  NEGATIVE mg/dL   Specific Gravity, Urine 1.010  1.005 - 1.030   Hgb urine dipstick TRACE (*) NEGATIVE   pH 7.0  5.0 - 8.0   Protein, ur NEGATIVE  NEGATIVE mg/dL   Urobilinogen, UA 2.0 (*) 0.0 - 1.0 mg/dL   Nitrite POSITIVE (*) NEGATIVE   Leukocytes, UA LARGE (*) NEGATIVE  POCT PREGNANCY, URINE     Status: None   Collection Time    08/18/13  1:04 PM  Result Value Range   Preg Test, Ur NEGATIVE  NEGATIVE   No results found.  Assessment and Plan: 47 y.o. female with urinary tract infection. Patient is allergic to penicillin. Urine cultures pending. Plan treatment with ciprofloxacin. Additionally his Phenergan as needed. Followup with primary care provider. Discussed warning signs or symptoms. Please see discharge instructions. Patient expresses understanding.       Rodolph Bong, MD 08/18/13 [phone]

## 2013-08-18 NOTE — ED Notes (Signed)
C/o uti since Saturday States she has abd pain and back pain States she has used AZO which turned urine orange

## 2013-08-20 LAB — URINE CULTURE: Colony Count: >=100000

## 2013-08-20 NOTE — ED Notes (Signed)
Urine culture: >100,000 colonies Klebsiella Pneumoniae.  Pt. adequately treated with Cipro. Vassie MoselleYork, Luke Falero M 08/20/2013

## 2013-11-16 ENCOUNTER — Other Ambulatory Visit: Payer: Self-pay | Admitting: Family Medicine

## 2013-11-16 DIAGNOSIS — Z1231 Encounter for screening mammogram for malignant neoplasm of breast: Secondary | ICD-10-CM

## 2014-10-24 ENCOUNTER — Other Ambulatory Visit: Payer: Self-pay | Admitting: Physician Assistant

## 2014-10-24 DIAGNOSIS — N644 Mastodynia: Secondary | ICD-10-CM

## 2014-10-26 ENCOUNTER — Other Ambulatory Visit: Payer: Self-pay | Admitting: Physician Assistant

## 2014-10-26 ENCOUNTER — Ambulatory Visit
Admission: RE | Admit: 2014-10-26 | Discharge: 2014-10-26 | Disposition: A | Discharge status: Home / Self Care | Payer: 59 | Source: Ambulatory Visit | Attending: Physician Assistant | Admitting: Physician Assistant

## 2014-10-26 DIAGNOSIS — N644 Mastodynia: Secondary | ICD-10-CM

## 2018-10-13 ENCOUNTER — Emergency Department (HOSPITAL_COMMUNITY)
Admission: EM | Admit: 2018-10-13 | Discharge: 2018-10-14 | Disposition: A | Discharge status: Home / Self Care | Payer: 59 | Attending: Emergency Medicine | Admitting: Emergency Medicine

## 2018-10-13 ENCOUNTER — Emergency Department (HOSPITAL_COMMUNITY): Payer: 59

## 2018-10-13 ENCOUNTER — Other Ambulatory Visit: Payer: Self-pay

## 2018-10-13 ENCOUNTER — Encounter (HOSPITAL_COMMUNITY): Payer: Self-pay | Admitting: Emergency Medicine

## 2018-10-13 DIAGNOSIS — R1013 Epigastric pain: Secondary | ICD-10-CM | POA: Diagnosis not present

## 2018-10-13 DIAGNOSIS — R278 Other lack of coordination: Secondary | ICD-10-CM | POA: Insufficient documentation

## 2018-10-13 DIAGNOSIS — Z79899 Other long term (current) drug therapy: Secondary | ICD-10-CM | POA: Diagnosis not present

## 2018-10-13 DIAGNOSIS — R918 Other nonspecific abnormal finding of lung field: Secondary | ICD-10-CM

## 2018-10-13 DIAGNOSIS — R42 Dizziness and giddiness: Secondary | ICD-10-CM | POA: Diagnosis present

## 2018-10-13 DIAGNOSIS — R410 Disorientation, unspecified: Secondary | ICD-10-CM | POA: Diagnosis not present

## 2018-10-13 DIAGNOSIS — R2681 Unsteadiness on feet: Secondary | ICD-10-CM | POA: Diagnosis not present

## 2018-10-13 DIAGNOSIS — H539 Unspecified visual disturbance: Secondary | ICD-10-CM | POA: Diagnosis not present

## 2018-10-13 DIAGNOSIS — R260 Ataxic gait: Secondary | ICD-10-CM | POA: Insufficient documentation

## 2018-10-13 DIAGNOSIS — H9319 Tinnitus, unspecified ear: Secondary | ICD-10-CM | POA: Diagnosis not present

## 2018-10-13 LAB — CBC
HEMATOCRIT: 40.3 % (ref 36.0–46.0)
Hemoglobin: 13.1 g/dL (ref 12.0–15.0)
MCH: 29.1 pg (ref 26.0–34.0)
MCHC: 32.5 g/dL (ref 30.0–36.0)
MCV: 89.6 fL (ref 80.0–100.0)
Platelets: 282 10*3/uL (ref 150–400)
RBC: 4.5 MIL/uL (ref 3.87–5.11)
RDW: 13.1 % (ref 11.5–15.5)
WBC: 8.2 10*3/uL (ref 4.0–10.5)
nRBC: 0 % (ref 0.0–0.2)

## 2018-10-13 LAB — BASIC METABOLIC PANEL
Anion gap: 10 (ref 5–15)
BUN: 7 mg/dL (ref 6–20)
CHLORIDE: 103 mmol/L (ref 98–111)
CO2: 22 mmol/L (ref 22–32)
Calcium: 9.3 mg/dL (ref 8.9–10.3)
Creatinine, Ser: 0.8 mg/dL (ref 0.44–1.00)
GFR calc Af Amer: >60 mL/min (ref >60)
GFR calc non Af Amer: >60 mL/min (ref >60)
Glucose, Bld: 100 mg/dL — ABNORMAL HIGH (ref 70–99)
POTASSIUM: 3.4 mmol/L — AB (ref 3.5–5.1)
Sodium: 135 mmol/L (ref 135–145)

## 2018-10-13 LAB — I-STAT BETA HCG BLOOD, ED (MC, WL, AP ONLY): I-stat hCG, quantitative: <5 m[IU]/mL (ref <5)

## 2018-10-13 LAB — I-STAT TROPONIN, ED: Troponin i, poc: 0 ng/mL (ref 0.00–0.08)

## 2018-10-13 MED ORDER — ALUM & MAG HYDROXIDE-SIMETH 200-200-20 MG/5ML PO SUSP
30.0000 mL | Freq: Once | ORAL | Status: AC
  Administered 2018-10-13: 30 mL via ORAL
  Filled 2018-10-13: qty 30

## 2018-10-13 MED ORDER — SUCRALFATE 1 G PO TABS
1.0000 g | ORAL_TABLET | Freq: Once | ORAL | Status: AC
  Administered 2018-10-14: 1 g via ORAL
  Filled 2018-10-13: qty 1

## 2018-10-13 MED ORDER — LIDOCAINE VISCOUS HCL 2 % MT SOLN
15.0000 mL | Freq: Once | OROMUCOSAL | Status: AC
  Administered 2018-10-13: 15 mL via ORAL
  Filled 2018-10-13: qty 15

## 2018-10-13 MED ORDER — SODIUM CHLORIDE 0.9% FLUSH: 3.0000 mL | Freq: Once | INTRAVENOUS | Status: DC

## 2018-10-13 MED ORDER — PROMETHAZINE HCL 25 MG/ML IJ SOLN: 25.0000 mg | Freq: Once | INTRAMUSCULAR | Status: DC

## 2018-10-13 NOTE — ED Provider Notes (Signed)
[facility] EMERGENCY DEPARTMENT Provider Note   CSN: [id number] Arrival date & time: 10/13/18  1943    History   Chief Complaint Chief Complaint  Patient presents with  . Chest Pain  . Shortness of Breath    HPI Kelsey Lang is a 53 y.o. female with a history of chronic back pain, anticardiolipin antibody IgM, complicated migraines with speech difficulties, and recurrent food impaction s/p esophageal dilatation x4 to the emergency department with a chief complaint of abdominal pain.  The patient endorses epigastric abdominal pain that feels like a "lump".  She states the pain is located in the middle of her abdomen and extends superiorly towards the bottom of her chest and is located between her breasts.  She reports the pain has been present for the last 4 days since having her esophagus stretched by GI at [facility].  She has been tolerating liquids and solid foods since the procedure.  She denies hematemesis, melena, or hematochezia.  However, she does report that she has been feeling short of breath since the procedure, but denies cough, URI symptoms, palpitations, diaphoresis, or leg swelling.  She reports associated dizziness, characterized as room spinning, that has been constant for the last 2 days along with a right-sided HA, nausea, right-sided tinnitus, diplopia, and blurred vision.  The patient also endorses 3-5 falls since onset of dizziness.  She denies hitting her head, syncope, emesis, numbness, weakness, slurred speech, facial droop, or seizures.  She reports that she is fallen due to the dizziness.  No history of similar.  The patient's coworker also reports that the patient has been more confused over the last few days.  She reports that she has been getting her confused with another coworker, but they work together for several years.  She reports that she received multiple lengthy rambling voicemails from the patient that included long periods of silence.   She states that the patient sentences made it since, but she seemed groggy and confused.  The patient states that she does not recall leaving the voicemails to the wrong person.  She denies taking any new medications are being prescribed pain medications following her recent surgical procedure.       The history is provided by the patient. No language interpreter was used.    Past Medical History:  Diagnosis Date  . 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 Chronic back pain   . Migraine    accompanied by speech difficulties "since I was 53 years old"    There are no active problems to display for this patient.   Past Surgical History:  Procedure Laterality Date  . ABDOMINAL HYSTERECTOMY       OB History   No obstetric history on file.      Home Medications    Prior to Admission medications   Medication Sig Start Date End Date Taking? Authorizing Provider  gabapentin (NEURONTIN) 100 MG capsule Take 100 mg by mouth 3 (three) times daily.   Yes [provider]  lisinopril (PRINIVIL,ZESTRIL) 10 MG tablet Take 10 mg by mouth daily. 05/26/18  Yes [provider]  metoprolol tartrate (LOPRESSOR) 25 MG tablet Take 25 mg by mouth 2 (two) times daily.   Yes [provider]  pantoprazole (PROTONIX) 40 MG tablet Take 80 mg by mouth 2 (two) times daily. 09/24/18  Yes [provider]  topiramate (TOPAMAX) 25 MG tablet Take 50 mg by mouth 2 (two) times daily. 11/15/15  Yes [provider]  meclizine (ANTIVERT) 25 MG tablet Take 1 tablet (25 mg total) by mouth 3 (three) times daily as needed for dizziness. 10/14/18   Sayed Apostol A, PA-C  promethazine (PHENERGAN) 25 MG tablet Take 1 tablet (25 mg total) by mouth every 6 (six) hours as needed for nausea or vomiting. 10/14/18   Davida Falconi A, PA-C  sucralfate (CARAFATE) 1 g tablet Take 1 tablet (1 g total) by mouth 4 (four) times daily for 14 days. 10/14/18 10/28/18  Tyronica Truxillo,  Coral Else, PA-C    Family History No family history on file.  Social History Social History   Tobacco Use  . Smoking status: Never Smoker  . Smokeless tobacco: Never Used  Substance Use Topics  . Alcohol use: No  . Drug use: No     Allergies   Penicillins and Acetaminophen   Review of Systems Review of Systems  Constitutional: Negative for activity change, chills and fever.  HENT: Positive for tinnitus. Negative for congestion, ear pain, nosebleeds, sinus pressure, sinus pain and sore throat.   Eyes: Positive for visual disturbance.  Respiratory: Positive for shortness of breath.   Cardiovascular: Positive for chest pain.  Gastrointestinal: Positive for abdominal pain and nausea. Negative for blood in stool, constipation and vomiting.  Genitourinary: Negative for dysuria, flank pain, hematuria, urgency, vaginal bleeding, vaginal discharge and vaginal pain.  Musculoskeletal: Negative for back pain, neck pain and neck stiffness.  Skin: Negative for rash.  Allergic/Immunologic: Negative for immunocompromised state.  Neurological: Positive for dizziness, light-headedness and headaches. Negative for seizures, syncope, speech difficulty, weakness and numbness.  Hematological: Does not bruise/bleed easily.  Psychiatric/Behavioral: Negative for confusion.     Physical Exam Updated Vital Signs BP (!) 148/88 (BP Location: Left Arm)   Pulse 78   Temp 98.6 F (37 C) (Oral)   Resp 16   SpO2 100%   Physical Exam Vitals signs and nursing note reviewed.  Constitutional:      General: She is not in acute distress. HENT:     Head: Normocephalic.     Comments: No tenderness to palpation to the temporal arteries bilaterally.    Right Ear: Hearing normal.     Left Ear: Hearing normal.     Ears:     Comments: Debris noted noted behind the right TM.  Left TM is unremarkable.  No mastoid tenderness bilaterally.    Nose:     Right Sinus: No maxillary sinus tenderness or frontal sinus  tenderness.     Left Sinus: No maxillary sinus tenderness or frontal sinus tenderness.     Mouth/Throat:     Pharynx: Oropharynx is clear. Uvula midline. No pharyngeal swelling, posterior oropharyngeal erythema or uvula swelling.  Eyes:     Extraocular Movements: Extraocular movements intact.     Conjunctiva/sclera: Conjunctivae normal.     Pupils: Pupils are equal, round, and reactive to light.  Neck:     Musculoskeletal: Neck supple.  Cardiovascular:     Rate and Rhythm: Normal rate and regular rhythm.     Pulses:          Dorsalis pedis pulses are 2+ on the right side and 2+ on the left side.       Posterior tibial pulses are 2+ on the right side and 2+ on the left side.     Heart sounds: Normal heart sounds. No murmur. No friction rub. No gallop.   Pulmonary:     Effort: Pulmonary effort is normal. No tachypnea  or respiratory distress.     Breath sounds: No stridor. No wheezing, rhonchi or rales.  Abdominal:     General: Bowel sounds are normal. There is no distension or abdominal bruit.     Palpations: Abdomen is soft.  Musculoskeletal:     Right lower leg: She exhibits no tenderness. No edema.     Left lower leg: She exhibits no tenderness. No edema.  Skin:    General: Skin is warm.     Coloration: Skin is not cyanotic.     Findings: No rash.  Neurological:     Mental Status: She is alert.     Comments: Dysmetria on the left.  4 out of 5 strength of the left upper extremity as compared to the right.  5-5 strength against resistance of the bilateral lower extremities.  Cranial nerves II through XII are grossly intact.  Gait is ataxic.  No pronator drift.  Negative Romberg.  No clonus bilaterally.  Follows simple commands.  Alert and oriented x4.  Psychiatric:        Behavior: Behavior normal.      ED Treatments / Results  Labs (all labs ordered are listed, but only abnormal results are displayed) Labs Reviewed  BASIC METABOLIC PANEL - Abnormal; Notable for the following  components:      Result Value   Potassium 3.4 (*)    Glucose, Bld 100 (*)    All other components within normal limits  CBC  TROPONIN I  I-STAT TROPONIN, ED  I-STAT BETA HCG BLOOD, ED ([facility], [facility], [facility] ONLY)    EKG EKG Interpretation  Date/Time:  Tuesday October 13 2018 19:50:37 EST Ventricular Rate:  72 PR Interval:  126 QRS Duration: 82 QT Interval:  404 QTC Calculation: 442 R Axis:   -25 Text Interpretation:  Normal sinus rhythm Possible Anterior infarct , age undetermined No significant change since last tracing Confirmed by Gwyneth Sprout ([id number]) on 10/13/2018 10:27:48 PM Also confirmed by Gwyneth Sprout ([id number]), editor Barbette Hair [phone])  on 10/14/2018 7:54:56 AM   Radiology Dg Chest 2 View  Result Date: 10/13/2018 CLINICAL DATA:  Epigastric pain and shortness of breath for 3 days. EXAM: CHEST - 2 VIEW COMPARISON:  None. FINDINGS: The lungs are clear. Heart size is normal. No pneumothorax or pleural fluid. No acute or focal bony abnormality. IMPRESSION: Negative chest. Electronically Signed   By: Drusilla Kanner M.D.   On: 10/13/2018 20:53   Ct Angio Chest Pe W And/or Wo Contrast  Result Date: 10/14/2018 CLINICAL DATA:  Chest pain and shortness of breath. History of clotting disorder. EXAM: CT ANGIOGRAPHY CHEST WITH CONTRAST TECHNIQUE: Multidetector CT imaging of the chest was performed using the standard protocol during bolus administration of intravenous contrast. Multiplanar CT image reconstructions and MIPs were obtained to evaluate the vascular anatomy. CONTRAST:  55mL ISOVUE-370 IOPAMIDOL (ISOVUE-370) INJECTION 76% COMPARISON:  Chest radiograph October 13, 2018 FINDINGS: CARDIOVASCULAR: Adequate contrast opacification of the pulmonary artery's. Main pulmonary artery is not enlarged. No pulmonary arterial filling defects to the level of the subsegmental branches. Heart size is normal, no right heart strain. No pericardial effusion. Thoracic aorta is normal course and  caliber, unremarkable. MEDIASTINUM/NODES: No lymphadenopathy by CT size criteria. Mildly patulous esophagus. LUNGS/PLEURA: Tracheobronchial tree is patent, no pneumothorax. Mild bronchial wall thickening. Mosaic attenuation. Multiple RIGHT upper lobe subsolid pulmonary nodules measuring to 6 mm (series 6, image 56). UPPER ABDOMEN: Non-acute.  17 mm cyst RIGHT lobe of the liver. MUSCULOSKELETAL: Non-acute.  Bilateral calcified breast  implants. Review of the MIP images confirms the above findings. IMPRESSION: 1. No acute pulmonary embolism. 2. Mosaic attenuation seen with small airway disease or pulmonary edema. Mild bronchial wall thickening seen with bronchitis or reactive airway disease. No pneumonia. 3. **An incidental finding of potential clinical significance has been found. Multiple subsolid pulmonary nodules measuring to 6 mm. Non-contrast chest CT at 3-6 months is recommended. If nodules persist, subsequent management will be based upon the most suspicious nodule(s). This recommendation follows the consensus statement: Guidelines for Management of Incidental Pulmonary Nodules Detected on CT Images: From the Fleischner Society 2017; Radiology 2017; 284:228-243. ** Electronically Signed   By: Awilda Metro M.D.   On: 10/14/2018 01:04   Mr Brain Wo Contrast  Result Date: 10/14/2018 CLINICAL DATA:  Subacute neuro deficits, word-finding difficulties. History of clotting disorder. EXAM: MRI HEAD WITHOUT CONTRAST MRV HEAD WITHOUT CONTRAST TECHNIQUE: Multiplanar, multiecho pulse sequences of the brain and surrounding structures were obtained without intravenous contrast. Angiographic images of the intracranial venous structures were obtained using MRV technique without intravenous contrast. COMPARISON:  CT HEAD February 05, 2013 and MRI head Dec 17, 2005 FINDINGS: MRI HEAD: INTRACRANIAL CONTENTS: No reduced diffusion to suggest acute ischemia or hyperacute demyelination. No susceptibility artifact to suggest  hemorrhage. No parenchymal brain volume loss for age. No hydrocephalus. Patchy supratentorial including temporal white matter FLAIR T2 hyperintensities measuring to 11 mm, faint pontine FLAIR T2 hyperintensities. Faint basal ganglia T2 hyperintensities seen with chronic small vessel ischemic changes. No suspicious parenchymal signal, masses, mass effect. No abnormal extra-axial fluid collections. No extra-axial masses. VASCULAR: Normal major intracranial vascular flow voids present at skull base. SKULL AND UPPER CERVICAL SPINE: No abnormal sellar expansion. No suspicious calvarial bone marrow signal. Craniocervical junction maintained. SINUSES/ORBITS: The mastoid air-cells and included paranasal sinuses are well-aerated.The included ocular globes and orbital contents are non-suspicious. Dysconjugate gaze may be transient. OTHER: Patient is edentulous. MRV HEAD: Normal flow related enhancement within the superior sagittal sinus, torcula of the Herophili, bilateral transverse, sigmoid sinuses and included internal jugular veins. Normal flow related enhancement of the internal cerebral veins. IMPRESSION: MRI HEAD: 1. No acute intracranial process. 2. Progressed moderate seen with migraine, sarcoidosis and/or old infection such as Lyme disease, possible chronic small vessel ischemic changes; atypical distribution for classic demyelination. Recommend neurological consultation. MRV HEAD: 1. Normal noncontrast MRV head. Electronically Signed   By: Awilda Metro M.D.   On: 10/14/2018 02:34   Mr Susie Cassette Head  Result Date: 10/14/2018 CLINICAL DATA:  Subacute neuro deficits, word-finding difficulties. History of clotting disorder. EXAM: MRI HEAD WITHOUT CONTRAST MRV HEAD WITHOUT CONTRAST TECHNIQUE: Multiplanar, multiecho pulse sequences of the brain and surrounding structures were obtained without intravenous contrast. Angiographic images of the intracranial venous structures were obtained using MRV technique without  intravenous contrast. COMPARISON:  CT HEAD February 05, 2013 and MRI head Dec 17, 2005 FINDINGS: MRI HEAD: INTRACRANIAL CONTENTS: No reduced diffusion to suggest acute ischemia or hyperacute demyelination. No susceptibility artifact to suggest hemorrhage. No parenchymal brain volume loss for age. No hydrocephalus. Patchy supratentorial including temporal white matter FLAIR T2 hyperintensities measuring to 11 mm, faint pontine FLAIR T2 hyperintensities. Faint basal ganglia T2 hyperintensities seen with chronic small vessel ischemic changes. No suspicious parenchymal signal, masses, mass effect. No abnormal extra-axial fluid collections. No extra-axial masses. VASCULAR: Normal major intracranial vascular flow voids present at skull base. SKULL AND UPPER CERVICAL SPINE: No abnormal sellar expansion. No suspicious calvarial bone marrow signal. Craniocervical junction maintained. SINUSES/ORBITS: The mastoid air-cells  and included paranasal sinuses are well-aerated.The included ocular globes and orbital contents are non-suspicious. Dysconjugate gaze may be transient. OTHER: Patient is edentulous. MRV HEAD: Normal flow related enhancement within the superior sagittal sinus, torcula of the Herophili, bilateral transverse, sigmoid sinuses and included internal jugular veins. Normal flow related enhancement of the internal cerebral veins. IMPRESSION: MRI HEAD: 1. No acute intracranial process. 2. Progressed moderate seen with migraine, sarcoidosis and/or old infection such as Lyme disease, possible chronic small vessel ischemic changes; atypical distribution for classic demyelination. Recommend neurological consultation. MRV HEAD: 1. Normal noncontrast MRV head. Electronically Signed   By: Awilda Metro M.D.   On: 10/14/2018 02:34    Procedures Procedures (including critical care time)  Medications Ordered in ED Medications  alum & mag hydroxide-simeth (MAALOX/MYLANTA) 200-200-20 MG/5ML suspension 30 mL (30 mLs Oral  Given 10/13/18 2234)    And  lidocaine (XYLOCAINE) 2 % viscous mouth solution 15 mL (15 mLs Oral Given 10/13/18 2234)  sucralfate (CARAFATE) tablet 1 g (1 g Oral Given 10/14/18 0018)  prochlorperazine (COMPAZINE) injection 10 mg (10 mg Intravenous Given 10/14/18 0019)  diphenhydrAMINE (BENADRYL) injection 25 mg (25 mg Intravenous Given 10/14/18 0019)  sodium chloride 0.9 % bolus 1,000 mL (0 mLs Intravenous Stopped 10/14/18 0222)  iopamidol (ISOVUE-370) 76 % injection (80 mLs  Contrast Given 10/14/18 0038)  meclizine (ANTIVERT) tablet 25 mg (25 mg Oral Given 10/14/18 0018)     Initial Impression / Assessment and Plan / ED Course  I have reviewed the triage vital signs and the nursing notes.  Pertinent labs & imaging results that were available during my care of the patient were reviewed by me and considered in my medical decision making (see chart for details).  53 year old female with a history of chronic back pain, anticardiolipin antibody IgM, complicated migraines with speech difficulties, and recurrent food impaction s/p esophageal dilatation x4 presenting with upper abdominal pain that began after an esophageal dilatation 4 days ago.  She is also having shortness of breath, dizziness, recurrent falls, headache, diplopia, blurred vision, nausea, and tinnitus.  On physical exam, she has 4 out of 5 strength of the left upper extremity as compared to the right as well as dysmetria on the left.  Gait is very ataxic.  No other focal neurologic findings.  She has a history of anticardiolipin antibodies and given her hypercoagulable state with a recent surgical procedure, will order PE study to assess for pulmonary embolism and MR and MR venogram to assess for venous sinus thrombosis.  Patient was discussed with Dr. Elesa Massed, attending physician.  The patient does have a history of complicated migraines that involve difficulty speaking.  High-grade cocktail and meclizine have been ordered.  EKG with normal  sinus rhythm.  Troponin is negative.  Labs are otherwise reassuring.  The study is negative for PE, but demonstrates mosaic attenuation seen with small airway disease or pulmonary edema.  Radiology also notes multiple sub-solid pulmonary micronodules measuring up to 6 mm.  Repeat CT is recommended at 3 to 6 months.  MR venogram with Patchy supratentorial including temporal white matterFLAIR T2 hyperintensities measuring to 11 mm, faint pontine FLAIR T2 hyperintensities. Faint basal ganglia T2 hyperintensities seen with chronic small vessel ischemic changes.  Findings are concerning for migraine versus sarcoidosis versus old infection such as Lyme disease, or possible chronic small vessel ischemic changes with an atypical distribution for classic demyelination.  Radiology recommends a neurological consult.  Clinical Course as of Oct 15 807  Wed Oct 14, 2018  0320 Patient recheck.  She was ambulated by me and continues to endorse dizziness.  Gait is still mildly ataxic, but somewhat improved.  Discussed MRI findings with the patient and that Dr. Otelia Limes with neurology would be evaluating her.  I also discussed that I recommended admission given ongoing dizziness.  Patient request to speak with neurology prior to making decision on whether she will be agreeable to admission.  We will plan for reevaluation after she is seen by Dr. Otelia Limes.   [MM]    Clinical Course User Index [MM] Andrue Dini A, PA-C   Following Dr. Otelia Limes seeing the patient, he reports continued improvement in her dizziness.  She was ambulated by him without ataxia.  She reports that she is feeling much better and would like to go home.  She has been given a referral to both pulmonology and neurology outpatient.  Will discharge with meclizine.  She was also given strict return precautions to the ER.  I suspect her abdominal pain was more GI in nature as opposed to cardiac given work up.  She reported significant improvement with Carafate  and antiemetic so I will discharge her with the same.  Strict return precautions given.  She is hemodynamically stable and in no acute distress.  She is here for discharge home with outpatient follow-up at this time.      Final Clinical Impressions(s) / ED Diagnoses   Final diagnoses:  Dizziness  Pulmonary nodules  Epigastric pain    ED Discharge Orders         Ordered    meclizine (ANTIVERT) 25 MG tablet  3 times daily PRN,   Status:  Discontinued     10/14/18 0411    ondansetron (ZOFRAN ODT) 4 MG disintegrating tablet  Every 8 hours PRN,   Status:  Discontinued     10/14/18 0411    sucralfate (CARAFATE) 1 g tablet  4 times daily     10/14/18 0411    meclizine (ANTIVERT) 25 MG tablet  3 times daily PRN     10/14/18 0415    promethazine (PHENERGAN) 25 MG tablet  Every 6 hours PRN     10/14/18 0415           Annalisse Minkoff, Pedro Earls A, PA-C 10/14/18 0810    Ward, Layla Maw, DO 10/18/18 2255

## 2018-10-13 NOTE — ED Triage Notes (Signed)
Pt BIB [facility] from home, c/o epigastric/shortness of breath x 3 days. Reports that she had her esophagus stretched on Friday. Also reports that sometimes she feels like she is unable to think of appropriate words. A&O x 4 at this time, answering questions appropriately.

## 2018-10-14 ENCOUNTER — Emergency Department (HOSPITAL_COMMUNITY): Payer: 59

## 2018-10-14 ENCOUNTER — Encounter (HOSPITAL_COMMUNITY): Payer: Self-pay | Admitting: Radiology

## 2018-10-14 DIAGNOSIS — R2681 Unsteadiness on feet: Secondary | ICD-10-CM

## 2018-10-14 DIAGNOSIS — R918 Other nonspecific abnormal finding of lung field: Secondary | ICD-10-CM

## 2018-10-14 DIAGNOSIS — R42 Dizziness and giddiness: Secondary | ICD-10-CM

## 2018-10-14 LAB — TROPONIN I: Troponin I: <0.03 ng/mL (ref <0.03)

## 2018-10-14 MED ORDER — ONDANSETRON 4 MG PO TBDP: 4.0000 mg | ORAL_TABLET | Freq: Three times a day (TID) | ORAL | 0 refills | Status: DC | PRN

## 2018-10-14 MED ORDER — DIPHENHYDRAMINE HCL 50 MG/ML IJ SOLN
25.0000 mg | Freq: Once | INTRAMUSCULAR | Status: AC
  Administered 2018-10-14: 25 mg via INTRAVENOUS
  Filled 2018-10-14: qty 1

## 2018-10-14 MED ORDER — SUCRALFATE 1 G PO TABS: 1.0000 g | ORAL_TABLET | Freq: Four times a day (QID) | ORAL | 0 refills | Status: AC

## 2018-10-14 MED ORDER — IOPAMIDOL (ISOVUE-370) INJECTION 76%
INTRAVENOUS | Status: AC
  Administered 2018-10-14: 80 mL
  Filled 2018-10-14: qty 100

## 2018-10-14 MED ORDER — SODIUM CHLORIDE 0.9 % IV BOLUS
1000.0000 mL | Freq: Once | INTRAVENOUS | Status: AC
  Administered 2018-10-14: 1000 mL via INTRAVENOUS

## 2018-10-14 MED ORDER — MECLIZINE HCL 25 MG PO TABS: 25.0000 mg | ORAL_TABLET | Freq: Three times a day (TID) | ORAL | 0 refills | Status: DC | PRN

## 2018-10-14 MED ORDER — PROCHLORPERAZINE EDISYLATE 10 MG/2ML IJ SOLN
10.0000 mg | Freq: Once | INTRAMUSCULAR | Status: AC
  Administered 2018-10-14: 10 mg via INTRAVENOUS
  Filled 2018-10-14: qty 2

## 2018-10-14 MED ORDER — PROMETHAZINE HCL 25 MG PO TABS: 25.0000 mg | ORAL_TABLET | Freq: Four times a day (QID) | ORAL | 0 refills | Status: AC | PRN

## 2018-10-14 MED ORDER — MECLIZINE HCL 25 MG PO TABS
25.0000 mg | ORAL_TABLET | Freq: Once | ORAL | Status: AC
  Administered 2018-10-14: 25 mg via ORAL
  Filled 2018-10-14: qty 1

## 2018-10-14 MED ORDER — MECLIZINE HCL 25 MG PO TABS: 25.0000 mg | ORAL_TABLET | Freq: Three times a day (TID) | ORAL | 0 refills | Status: AC | PRN

## 2018-10-14 NOTE — ED Notes (Signed)
Patient verbalizes understanding of discharge instructions. Opportunity for questioning and answers were provided. Armband removed by staff, pt discharged from ED in wheelchair.  

## 2018-10-14 NOTE — Discharge Instructions (Addendum)
Thank you for allowing me to care for you today in the Emergency Department.   Please call to schedule follow-up appointment with both neurology and pulmonology.  Their contact information is listed above.  You may take 1 tablet of meclizine every 8 hours as needed for dizziness.  Follow-up with gastroenterology if your abdominal pain does not start to improve in the next couple of days.  You may take 1 tablet of Phenergan every 8 hours as needed for nausea or vomiting and take 1 tablet of Carafate every 6 hours to help coat your stomach.  You were given your first dose of both of these medications in the ER.  Use caution with taking both Phenergan and meclizine because both of these medications can make you drowsy.  Do not drink alcohol or take any other sedating substances while taking his medications.  Return to the emergency department if you develop black or bloody stools, loss of vision in one or both eyes, new numbness or weakness, severe headache with a high fever, neck stiffness, or other new, concerning symptoms.

## 2018-10-14 NOTE — Consult Note (Signed)
NEURO HOSPITALIST CONSULT NOTE   Requesting physician: Dr. Elesa Massed  Reason for Consult: Dizziness with gait unsteadiness and falls  History obtained from:   Patient, Friend and Chart    HPI:                                                                                                                                          Kelsey Lang is an 53 y.o. female who presented via EMS with a band of pain along her anterior chest about 5 cm below the nipple line, as well as SOB x 3 days. She had an esophageal dilatation on Friday. During the ED evaluation, she reported that she was having trouble speaking, starting on Sunday. Neurology was consulted to further evaluate.   During the neurological evaluation, her friend plays back a phone call on Sunday, during which the patient sounds sedated but with normal grammar and syntax without word finding deficit.   The patient states that when she came in, she had an 8/10 migraine headache which began on Sunday to the right side of her head with 8/10 constant nonthrobbing pain and photophobia. Her pain has decreased to a 3/10 with medication in the ED. She states her slurred speech began shortly after onset of the migraine. Additional symptoms included presyncopal sensation in conjunction with "floating on a boat" dizziness, gait unsteadiness and several falls.   She has a history of a positive anticardiolipin antibody test.   MRI HEAD: 1. No acute intracranial process. 2. Progressed moderate white matter changes which can be seen with migraine, sarcoidosis and/or old infection such as Lyme disease, as well as possible chronic small vessel ischemic changes; atypical distribution for classic demyelination.   MRV HEAD: 1. Normal noncontrast MRV head.  Past Medical History:  Diagnosis Date  . 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 Chronic back pain   . Migraine    accompanied by speech difficulties  "since I was 53 years old"    Past Surgical History:  Procedure Laterality Date  . ABDOMINAL HYSTERECTOMY      No family history on file.            Social History:  reports that she has never smoked. She has never used smokeless tobacco. She reports that she does not drink alcohol or use drugs.  Allergies  Allergen Reactions  . Penicillins Anaphylaxis  . Acetaminophen Hives and Other (See Comments)    High doses    HOME MEDICATIONS:  ROS:                                                                                                                                       As per HPI. All other systems negative.    Blood pressure (!) 151/92, pulse 70, temperature 98 F (36.7 C), temperature source Oral, resp. rate 16, SpO2 98 %.   General Examination:                                                                                                       Physical Exam  HEENT-  [employer]/AT   Lungs- Respirations unlabored Extremities- No edema. Warm and well perfused.   Neurological Examination Mental Status: Alert, fully oriented, thought content appropriate.  Pleasant and cooperative. Speech fluent with intact naming and comprehension. Able to follow a 3 step directional command without difficulty. Cranial Nerves: II: Visual fields intact with no extinction to DSS III,IV, VI: Ptosis not present. EOMI with mild saccadic quality of visual pursuits noted. No nystagmus.  V,VII: Smile symmetric, facial temp sensation subjectively decreased to left lower quadrant and right forehead.  VIII: hearing intact to conversation.  IX,X: Palate rises symmetrically XI: Symmetric shoulder shrug XII: midline tongue extension Motor: Right : Upper extremity   5/5    Left:     Upper extremity   5/5  Lower extremity   5/5     Lower extremity   5/5 Normal tone throughout; no atrophy  noted Sensory: Temp and light touch intact x 4 except for decreased temp to right forearm, with increased temp sensitivity to right upper arm. No extinction.  Deep Tendon Reflexes: 2+ and symmetric bilateral brachioradialis, biceps, patellae and achilles. Toes downgoing bilaterally. Cerebellar: No ataxia with FNF and H-S bilaterally  Gait: Able to stand from seated and squatting positions with own power. Narrow based with one episode of mild unsteadiness. Good turns. Negative Romberg. No listing to R or L.    Lab Results: Basic Metabolic Panel: Recent Labs  Lab 10/13/18 2001  NA 135  K 3.4*  CL 103  CO2 22  GLUCOSE 100*  BUN 7  CREATININE 0.80  CALCIUM 9.3    CBC: Recent Labs  Lab 10/13/18 2001  WBC 8.2  HGB 13.1  HCT 40.3  MCV 89.6  PLT 282    Cardiac Enzymes: Recent Labs  Lab 10/14/18 0021  TROPONINI <0.03    Lipid Panel: No results for input(s): CHOL, TRIG, HDL, CHOLHDL, VLDL, LDLCALC in  the last 168 hours.  Imaging: Dg Chest 2 View  Result Date: 10/13/2018 CLINICAL DATA:  Epigastric pain and shortness of breath for 3 days. EXAM: CHEST - 2 VIEW COMPARISON:  None. FINDINGS: The lungs are clear. Heart size is normal. No pneumothorax or pleural fluid. No acute or focal bony abnormality. IMPRESSION: Negative chest. Electronically Signed   By: Drusilla Kanner M.D.   On: 10/13/2018 20:53   Ct Angio Chest Pe W And/or Wo Contrast  Result Date: 10/14/2018 CLINICAL DATA:  Chest pain and shortness of breath. History of clotting disorder. EXAM: CT ANGIOGRAPHY CHEST WITH CONTRAST TECHNIQUE: Multidetector CT imaging of the chest was performed using the standard protocol during bolus administration of intravenous contrast. Multiplanar CT image reconstructions and MIPs were obtained to evaluate the vascular anatomy. CONTRAST:  5mL ISOVUE-370 IOPAMIDOL (ISOVUE-370) INJECTION 76% COMPARISON:  Chest radiograph October 13, 2018 FINDINGS: CARDIOVASCULAR: Adequate contrast  opacification of the pulmonary artery's. Main pulmonary artery is not enlarged. No pulmonary arterial filling defects to the level of the subsegmental branches. Heart size is normal, no right heart strain. No pericardial effusion. Thoracic aorta is normal course and caliber, unremarkable. MEDIASTINUM/NODES: No lymphadenopathy by CT size criteria. Mildly patulous esophagus. LUNGS/PLEURA: Tracheobronchial tree is patent, no pneumothorax. Mild bronchial wall thickening. Mosaic attenuation. Multiple RIGHT upper lobe subsolid pulmonary nodules measuring to 6 mm (series 6, image 56). UPPER ABDOMEN: Non-acute.  17 mm cyst RIGHT lobe of the liver. MUSCULOSKELETAL: Non-acute.  Bilateral calcified breast implants. Review of the MIP images confirms the above findings. IMPRESSION: 1. No acute pulmonary embolism. 2. Mosaic attenuation seen with small airway disease or pulmonary edema. Mild bronchial wall thickening seen with bronchitis or reactive airway disease. No pneumonia. 3. **An incidental finding of potential clinical significance has been found. Multiple subsolid pulmonary nodules measuring to 6 mm. Non-contrast chest CT at 3-6 months is recommended. If nodules persist, subsequent management will be based upon the most suspicious nodule(s). This recommendation follows the consensus statement: Guidelines for Management of Incidental Pulmonary Nodules Detected on CT Images: From the Fleischner Society 2017; Radiology 2017; 284:228-243. ** Electronically Signed   By: Awilda Metro M.D.   On: 10/14/2018 01:04   Mr Brain Wo Contrast  Result Date: 10/14/2018 CLINICAL DATA:  Subacute neuro deficits, word-finding difficulties. History of clotting disorder. EXAM: MRI HEAD WITHOUT CONTRAST MRV HEAD WITHOUT CONTRAST TECHNIQUE: Multiplanar, multiecho pulse sequences of the brain and surrounding structures were obtained without intravenous contrast. Angiographic images of the intracranial venous structures were obtained using  MRV technique without intravenous contrast. COMPARISON:  CT HEAD February 05, 2013 and MRI head Dec 17, 2005 FINDINGS: MRI HEAD: INTRACRANIAL CONTENTS: No reduced diffusion to suggest acute ischemia or hyperacute demyelination. No susceptibility artifact to suggest hemorrhage. No parenchymal brain volume loss for age. No hydrocephalus. Patchy supratentorial including temporal white matter FLAIR T2 hyperintensities measuring to 11 mm, faint pontine FLAIR T2 hyperintensities. Faint basal ganglia T2 hyperintensities seen with chronic small vessel ischemic changes. No suspicious parenchymal signal, masses, mass effect. No abnormal extra-axial fluid collections. No extra-axial masses. VASCULAR: Normal major intracranial vascular flow voids present at skull base. SKULL AND UPPER CERVICAL SPINE: No abnormal sellar expansion. No suspicious calvarial bone marrow signal. Craniocervical junction maintained. SINUSES/ORBITS: The mastoid air-cells and included paranasal sinuses are well-aerated.The included ocular globes and orbital contents are non-suspicious. Dysconjugate gaze may be transient. OTHER: Patient is edentulous. MRV HEAD: Normal flow related enhancement within the superior sagittal sinus, torcula of the Herophili, bilateral transverse, sigmoid sinuses  and included internal jugular veins. Normal flow related enhancement of the internal cerebral veins. IMPRESSION: MRI HEAD: 1. No acute intracranial process. 2. Progressed moderate seen with migraine, sarcoidosis and/or old infection such as Lyme disease, possible chronic small vessel ischemic changes; atypical distribution for classic demyelination. Recommend neurological consultation. MRV HEAD: 1. Normal noncontrast MRV head. Electronically Signed   By: Awilda Metro M.D.   On: 10/14/2018 02:34   Mr Susie Cassette Head  Result Date: 10/14/2018 CLINICAL DATA:  Subacute neuro deficits, word-finding difficulties. History of clotting disorder. EXAM: MRI HEAD WITHOUT CONTRAST  MRV HEAD WITHOUT CONTRAST TECHNIQUE: Multiplanar, multiecho pulse sequences of the brain and surrounding structures were obtained without intravenous contrast. Angiographic images of the intracranial venous structures were obtained using MRV technique without intravenous contrast. COMPARISON:  CT HEAD February 05, 2013 and MRI head Dec 17, 2005 FINDINGS: MRI HEAD: INTRACRANIAL CONTENTS: No reduced diffusion to suggest acute ischemia or hyperacute demyelination. No susceptibility artifact to suggest hemorrhage. No parenchymal brain volume loss for age. No hydrocephalus. Patchy supratentorial including temporal white matter FLAIR T2 hyperintensities measuring to 11 mm, faint pontine FLAIR T2 hyperintensities. Faint basal ganglia T2 hyperintensities seen with chronic small vessel ischemic changes. No suspicious parenchymal signal, masses, mass effect. No abnormal extra-axial fluid collections. No extra-axial masses. VASCULAR: Normal major intracranial vascular flow voids present at skull base. SKULL AND UPPER CERVICAL SPINE: No abnormal sellar expansion. No suspicious calvarial bone marrow signal. Craniocervical junction maintained. SINUSES/ORBITS: The mastoid air-cells and included paranasal sinuses are well-aerated.The included ocular globes and orbital contents are non-suspicious. Dysconjugate gaze may be transient. OTHER: Patient is edentulous. MRV HEAD: Normal flow related enhancement within the superior sagittal sinus, torcula of the Herophili, bilateral transverse, sigmoid sinuses and included internal jugular veins. Normal flow related enhancement of the internal cerebral veins. IMPRESSION: MRI HEAD: 1. No acute intracranial process. 2. Progressed moderate seen with migraine, sarcoidosis and/or old infection such as Lyme disease, possible chronic small vessel ischemic changes; atypical distribution for classic demyelination. Recommend neurological consultation. MRV HEAD: 1. Normal noncontrast MRV head.  Electronically Signed   By: Awilda Metro M.D.   On: 10/14/2018 02:34    Assessment: 53 year old female with multiple somatic and neurological complaints 1. Neurological exam is nonfocal.  2. MRI head images were personally reviewed. No acute abnormalities are appreciated. The WM hyperintensities most likely are secondary to chronic small vessel ischemia given her age and history of HTN. Lyme disease is unlikely as patient cannot recall a history of tick bite, bull's eye rash, prolonged fever/malaise/myagia/headache. Sarcoidosis is possible given the pulmonary nodules seen on CT chest, but again the nonspecific WM hyperintensities on MRI would most likely be secondary to chronic microvascular ischemia, both based on the appearance as well as from an epidemiological standpoint.   3. MRV head is normal.   Recommendations: 1. Start ASA 81 mg po qd 2. Follow up with [facility] for migraine headaches as well as for second opinion regarding the mild nonspecific white matter T2 hyperintensities seen on MRI obtained during this ED visit. Will defer to outpatient neurology regarding possible hypercoagulable panel and repeat anticardiolipin antibody. May benefit from a repeat MRI brain in one year's time. 3. Follow up with Pulmonology for lung nodules seen on chest imaging. DDx includes pulmonary sarcoidosis.     Electronically signed: Dr. Caryl Pina 10/14/2018, 3:12 AM

## 2018-10-14 NOTE — ED Notes (Signed)
Patient transported to CT 

## 2018-10-14 NOTE — ED Notes (Signed)
Patient transported to MRI 

## 2018-10-22 ENCOUNTER — Encounter: Payer: Self-pay | Admitting: Internal Medicine

## 2018-10-22 ENCOUNTER — Ambulatory Visit: Payer: 59 | Admitting: Internal Medicine

## 2018-10-22 VITALS — BP 144/84 | HR 87 | Ht 64.0 in | Wt 144.6 lb

## 2018-10-22 DIAGNOSIS — K219 Gastro-esophageal reflux disease without esophagitis: Secondary | ICD-10-CM | POA: Diagnosis not present

## 2018-10-22 DIAGNOSIS — K222 Esophageal obstruction: Secondary | ICD-10-CM

## 2018-10-22 DIAGNOSIS — R918 Other nonspecific abnormal finding of lung field: Secondary | ICD-10-CM | POA: Diagnosis not present

## 2018-10-22 DIAGNOSIS — I10 Essential (primary) hypertension: Secondary | ICD-10-CM

## 2018-10-22 DIAGNOSIS — T464X5A Adverse effect of angiotensin-converting-enzyme inhibitors, initial encounter: Secondary | ICD-10-CM

## 2018-10-22 MED ORDER — TELMISARTAN 80 MG PO TABS: 80.0000 mg | ORAL_TABLET | Freq: Every day | ORAL | 11 refills | Status: AC

## 2018-10-22 NOTE — Patient Instructions (Addendum)
Continue protonix as per your gi doctor  As needed carate slurry up to 4 x daily for chest pain  Stop lisinopril and start micardis 80 mg one daily in addition to the metaprolol but ok to double metaprolol if bp not optimal   GERD (REFLUX)  is an extremely common cause of respiratory symptoms just like yours , many times with no obvious heartburn at all.    It can be treated with medication, but also with lifestyle changes including elevation of the head of your bed (ideally with 6 -8inch blocks under the headboard of your bed),  Smoking cessation, avoidance of late meals, excessive alcohol, and avoid fatty foods, chocolate, peppermint, colas, red wine, and acidic juices such as orange juice.  NO MINT OR MENTHOL PRODUCTS SO NO COUGH DROPS  USE SUGARLESS CANDY INSTEAD (Jolley ranchers or Stover's or Life Savers) or even ice chips will also do - the key is to swallow to prevent all throat clearing. NO OIL BASED VITAMINS - use powdered substitutes.  Avoid fish oil when coughing.    Please schedule a follow up office visit in 4 weeks, sooner if needed  with all medications /inhalers/ solutions in hand so we can verify exactly what you are taking. This includes all medications from all doctors and over the counters

## 2018-10-22 NOTE — Progress Notes (Signed)
Kelsey Lang, female    DOB: 03/01/1966,  MRN: [id number]   Brief patient profile:  52 yowf never smoker with HELP/ high cardiolipins  post partum 2001 on asa 81 mg "prn" no longer followed by hematology or renal with severe gerd requiring pantoprazole complicated by  Es sticture req dilation  Last previous  around 2018 in [place] started ACEi 07/2018 with recurrent dysphagia felt like bread crumbs were getting stuck to point where over a few days developed sensatoin "can't swallow spit so underwent emergency EGD 10/09/18 and swallowing improved some but  then abrupt sob/cp /  > to [facility] ER where had CTa 10/14/2018 and mpn none larger than 78mm so rec restart baby asa and referred to pulmonary clinic 10/22/2018 by  Dr Rochele Raring .  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 Pulmonary Consult    Referred by Dr. Baxter Hire Ward for eval of pulmonary nodules. Pt c/o SOB since had esophageal dilation 10/13/2018. She was SOB walking from our parking lot to our building today. She also c/o chest tightness.   Dyspnea:  Across a parking lot x years  Cough: not now  but feels something is stuck in throat points just below SS notch and gen chest feels tight/ uncomfortable - has not used carafate as rec  Sleep: one pillow fine  SABA use: none   No obvious day to day or daytime variability or assoc excess/ purulent sputum or mucus plugs or hemoptysis  , subjective wheeze or overt sinus  symptoms.   Sleeping as abov e without nocturnal  or early am exacerbation  of respiratory  c/o's or need for noct saba. Also denies any obvious fluctuation of symptoms with weather or environmental changes or other aggravating or alleviating factors except as outlined above   No unusual exposure hx or h/o childhood pna/ asthma or knowledge of premature birth.  Current  Allergies, Complete Past Medical History, Past Surgical History, Family History, and Social History were reviewed in [employer] record.  ROS  The following are not active complaints unless bolded Hoarseness, sore throat, dysphagia, dental problems, itching, sneezing,  nasal congestion or discharge of excess mucus or purulent secretions, ear ache,   fever, chills, sweats, unintended wt loss or wt gain, classically pleuritic or exertional cp,  orthopnea pnd or arm/hand swelling  or leg swelling, presyncope, palpitations, abdominal pain, anorexia, nausea, vomiting, diarrhea  or change in bowel habits or change in bladder habits, change in stools or change in urine, dysuria, hematuria,  rash, arthralgias, visual complaints, headache, numbness, weakness or ataxia or problems with walking or coordination,  change in mood or  memory.           Past Medical History:  Diagnosis Date  . 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 Chronic back pain   . Migraine    accompanied by speech difficulties "since I was 53 years old"    Outpatient Medications Prior to Visit  Medication Sig Dispense Refill  . gabapentin (NEURONTIN) 100 MG capsule Take 100 mg by mouth 3 (three) times daily.    Marland Kitchen lisinopril (PRINIVIL,ZESTRIL) 10 MG tablet Take 10 mg by mouth daily.    . meclizine (ANTIVERT) 25 MG tablet Take 1 tablet (25 mg total) by mouth 3 (three)  times daily as needed for dizziness. 30 tablet 0  . metoprolol tartrate (LOPRESSOR) 25 MG tablet Take 25 mg by mouth 2 (two) times daily.    . pantoprazole (PROTONIX) 40 MG tablet Take 80 mg by mouth 2 (two) times daily.    . promethazine (PHENERGAN) 25 MG tablet Take 1 tablet (25 mg total) by mouth every 6 (six) hours as needed for nausea or vomiting. 30 tablet 0  . sucralfate (CARAFATE) 1 g tablet Take 1 tablet (1 g total) by mouth 4 (four) times daily for 14 days. 56 tablet 0  . topiramate (TOPAMAX) 25 MG tablet Take 50  mg by mouth 2 (two) times daily.        Objective:     BP (!) 144/84 (BP Location: Left Arm, Cuff Size: Normal)   Pulse 87   Ht 5\' 4"  (1.626 m)   Wt 144 lb 9.6 oz (65.6 kg)   SpO2 97%   BMI 24.82 kg/m   SpO2: 97 %  RA  amb wf nad    HEENT: Edentulous/ nl turbinates bilaterally, and oropharynx. Nl external ear canals without cough reflex   NECK :  without JVD/Nodes/TM/ nl carotid upstrokes bilaterally   LUNGS: no acc muscle use,  Nl contour chest which is clear to A and P bilaterally without cough on insp or exp maneuvers   CV:  RRR  no s3 or murmur or increase in P2, and no edema   ABD:  soft and nontender with nl inspiratory excursion in the supine position. No bruits or organomegaly appreciated, bowel sounds nl  MS:  Nl gait/ ext warm without deformities, calf tenderness, cyanosis or clubbing No obvious joint restrictions   SKIN: warm and dry without lesions    NEURO:  alert, approp, nl sensorium with  no motor or cerebellar deficits apparent.     I personally reviewed images and agree with radiology impression as follows:   Chest CT 10/14/2018 1. No acute pulmonary embolism. 2. Mosaic attenuation seen with small airway disease or pulmonary edema. Mild bronchial wall thickening seen with bronchitis or reactive airway disease. No pneumonia. 3. **An incidental finding of potential clinical significance has been found. Multiple subsolid pulmonary nodules measuring to 6 mm. (not seen on cxr and no prior ct's available) Non-contrast chest CT at 3-6 months is recommended      Assessment   Multiple lung nodules on CT See CT chest 10/14/18 > rec repeat in 04/14/2019> placed in reminder file  Although there are clearly abnormalities on CT scan, they should probably be considered "microscopic" since not obvious on plain cxr and I very strongly doubt sarcoid here.   In the setting of obvious "macroscopic" health issues,  I am very reluctatnt to embark on an invasive  w/u at this point but will arrange consevative  follow up and in the meantime see what we can do to address the patient's subjective concerns 9see above)   CT results reviewed with pt >>> Too small for PET or bx, not suspicious enough for excisional bx > really only option for now is follow the Fleischner society guidelines as rec by radiology = 6 months, sooner if new or persistent symptoms off acei.   Discussed in detail all the  indications, usual  risks and alternatives  relative to the benefits with patient who agrees to proceed with conservative f/u as outlined        Adverse reaction to ACE inhibitor drug, initial encounter Persistent dysphagia/ globus 10/22/2018 p  EGD  10/09/2018 > d/c ACEi    In the best review of chronic cough to date ( NEJM 2016 375 1610-9604) ,  ACEi are now felt to cause cough in up to  20% of pts which is a 4 fold increase from previous reports and does not include the variety of non-specific complaints we see in pulmonary clinic in pts on ACEi but previously attributed to another dx like  Copd/asthma and  include PNDS, throat and chest congestion, "bronchitis", unexplained dyspnea and noct "strangling" sensations, and hoarseness, but also  atypical /refractory GERD symptoms like dysphagia and "bad heartburn"   The only way I know  to prove this is not an "ACEi Case" is a trial off ACEi x a minimum of 6 weeks then regroup with further w/u if symptoms haven't resolved  Discussed in detail all the  indications, usual  risks and alternatives  relative to the benefits with patient who agrees to proceed with rx as outlined.       Essential hypertension Changed acei to arb 10/22/2018 due to cough and dysphagia/ globus sensation   >>> rec try micardis 80 mg can break in half if too strong Can titrate up lopressor to 50 mg if too weak/ advised to  Monitor HR for signs of too much or too little BB effects     GERD with stricture S/p dilation 10/09/2018  - rec carafate  slurry prn 10/22/2018  One of the causes of refractory sense of dysphagia is acei effect on upper airwway and I have seen numerous cases (actually she is the second today) of refractory "gerd" in pt on ACEi so rec  >>> off acei as above/ carafate slurry prn/ f/u GI as planned in [place]      Total time devoted to counseling  > 50 % of initial 60 min office visit:  review case with pt/ discussion of options/alternatives/ personally creating written customized instructions  in presence of pt  then going over those specific  Instructions directly with the pt including how to use all of the meds but in particular covering each new medication in detail and the difference between the maintenance= "automatic" meds and the prns using an action plan format for the latter (If this problem/symptom => do that organization reading Left to right).  Please see AVS from this visit for a full list of these instructions which I personally wrote for this pt and  are unique to this visit.        Sandrea Hughs, MD 10/22/2018

## 2018-10-23 ENCOUNTER — Encounter: Payer: Self-pay | Admitting: Internal Medicine

## 2018-10-23 DIAGNOSIS — T464X5A Adverse effect of angiotensin-converting-enzyme inhibitors, initial encounter: Secondary | ICD-10-CM | POA: Insufficient documentation

## 2018-10-23 DIAGNOSIS — I10 Essential (primary) hypertension: Secondary | ICD-10-CM | POA: Insufficient documentation

## 2018-10-23 DIAGNOSIS — R918 Other nonspecific abnormal finding of lung field: Secondary | ICD-10-CM | POA: Insufficient documentation

## 2018-10-23 DIAGNOSIS — K222 Esophageal obstruction: Secondary | ICD-10-CM

## 2018-10-23 DIAGNOSIS — K219 Gastro-esophageal reflux disease without esophagitis: Secondary | ICD-10-CM | POA: Insufficient documentation

## 2018-10-23 NOTE — Assessment & Plan Note (Signed)
Persistent dysphagia/ globus 10/22/2018 p EGD  10/09/2018 > d/c ACEi    In the best review of chronic cough to date ( NEJM 2016 375 [id number]) ,  ACEi are now felt to cause cough in up to  20% of pts which is a 4 fold increase from previous reports and does not include the variety of non-specific complaints we see in pulmonary clinic in pts on ACEi but previously attributed to another dx like  Copd/asthma and  include PNDS, throat and chest congestion, "bronchitis", unexplained dyspnea and noct "strangling" sensations, and hoarseness, but also  atypical /refractory GERD symptoms like dysphagia and "bad heartburn"   The only way I know  to prove this is not an "ACEi Case" is a trial off ACEi x a minimum of 6 weeks then regroup with further w/u if symptoms haven't resolved  Discussed in detail all the  indications, usual  risks and alternatives  relative to the benefits with patient who agrees to proceed with rx as outlined.

## 2018-10-23 NOTE — Assessment & Plan Note (Addendum)
See CT chest 10/14/18 > rec repeat in 04/14/2019> placed in reminder file   Although there are clearly abnormalities on CT scan, they should probably be considered "microscopic" since not obvious on plain cxr and I very strongly doubt sarcoid here.   In the setting of obvious "macroscopic" health issues,  I am very reluctatnt to embark on an invasive w/u at this point but will arrange consevative  follow up and in the meantime see what we can do to address the patient's subjective concerns 9see above)   CT results reviewed with pt >>> Too small for PET or bx, not suspicious enough for excisional bx > really only option for now is follow the Fleischner society guidelines as rec by radiology = 6 m    Discussed in detail all the  indications, usual  risks and alternatives  relative to the benefits with patient who agrees to proceed with conservative f/u as outlined      Total time devoted to counseling  > 50 % of initial 60 min office visit:  review case with pt/ discussion of options/alternatives/ personally creating written customized instructions  in presence of pt  then going over those specific  Instructions directly with the pt including how to use all of the meds but in particular covering each new medication in detail and the difference between the maintenance= "automatic" meds and the prns using an action plan format for the latter (If this problem/symptom => do that organization reading Left to right).  Please see AVS from this visit for a full list of these instructions which I personally wrote for this pt and  are unique to this visit.

## 2018-10-23 NOTE — Assessment & Plan Note (Signed)
S/p dilation 10/09/2018  - rec carafate slurry prn 10/22/2018  One of the causes of refractory sense of dysphagia is acei effect on upper airwway and I have seen numerous cases (actually she is the second today) of refractory "gerd" in pt on ACEi so rec  >>> off acei as above/ carafate slurry prn/ f/u GI as planned in [place]

## 2018-10-23 NOTE — Assessment & Plan Note (Signed)
Changed acei to arb 10/22/2018 due to cough and dysphagia/ globus sensation   >>> rec try micardis 80 mg can break in half if too strong Can titrate up lopressor to 50 mg if too weak/ advised to  Monitor HR for signs of too much or too little BB effects

## 2018-11-20 ENCOUNTER — Other Ambulatory Visit: Payer: Self-pay

## 2018-11-20 ENCOUNTER — Telehealth: Payer: 59 | Admitting: Nurse Practitioner

## 2018-11-20 ENCOUNTER — Ambulatory Visit: Payer: 59 | Admitting: Internal Medicine

## 2018-11-20 ENCOUNTER — Ambulatory Visit (INDEPENDENT_AMBULATORY_CARE_PROVIDER_SITE_OTHER): Payer: 59 | Admitting: Nurse Practitioner

## 2018-11-20 DIAGNOSIS — R05 Cough: Secondary | ICD-10-CM | POA: Diagnosis not present

## 2018-11-20 DIAGNOSIS — R053 Chronic cough: Secondary | ICD-10-CM

## 2018-11-20 NOTE — Progress Notes (Signed)
Virtual Visit via Telephone Note  I connected with Kelsey Lang on 11/23/18 at  2:30 PM EDT by telephone and verified that I am speaking with the correct person using two identifiers.   I discussed the limitations, risks, security and privacy concerns of performing an evaluation and management service by telephone and the availability of in person appointments. I also discussed with the patient that there may be a patient responsible charge related to this service. The patient expressed understanding and agreed to proceed.   History of Present Illness: 53 year old never smoker chronic cough followed by Dr. Sherene Sires.  Patient has telephone visit today for follow-up.  Was last seen by Dr. Sherene Sires on 10/22/2018 - changed acei to arb 10/22/2018 due to cough and dysphagia/ globus sensation.  At last visit she was started on micardis 80 mg Lopressor daily.  She states that she has been doing well since last visit.  She is concerned because her blood pressures have been fluctuating.  States that she has taken Micardis at night and Lopressor twice daily.  Checking her blood pressures at random times throughout the day.Denies f/c/s, n/v/d, hemoptysis, PND, leg swelling.    Observations/Objective:  CXR 10/13/18 - Negative chest.  Assessment and Plan: Discussion: At last visit she was started on micardis 80 mg Lopressor daily.  She states that she has been doing well since last visit.  She is concerned because her blood pressures have been fluctuating.  States that she has taken Micardis at night and Lopressor twice daily.  She is checking her blood pressures at random times throughout the day.  We discussed that it might be better for her to take the Micardis in the morning first thing.  She can still take Lopressor twice daily.  Check blood pressures consistently every morning 2 hours after taking medications.  Please keep a log for follow-up visit.  Patient Instructions  Please take micardis every  morning Please take lopressor twice daily Check blood pressure and pulse each morning 2 hours after taking medications and keep log Low sodium diet Continue protonix as per your gi doctor  GERD (REFLUX)  is an extremely common cause of respiratory symptoms just like yours , many times with no obvious heartburn at all.    It can be treated with medication, but also with lifestyle changes including elevation of the head of your bed (ideally with 6 -8inch blocks under the headboard of your bed),  Smoking cessation, avoidance of late meals, excessive alcohol, and avoid fatty foods, chocolate, peppermint, colas, red wine, and acidic juices such as orange juice.  NO MINT OR MENTHOL PRODUCTS SO NO COUGH DROPS  USE SUGARLESS CANDY INSTEAD (Jolley ranchers or Stover's or Life Savers) or even ice chips will also do - the key is to swallow to prevent all throat clearing. NO OIL BASED VITAMINS - use powdered substitutes.  Avoid fish oil when coughing.    Please schedule a follow up office visit in 4 weeks, sooner if needed  with all medications /inhalers/ solutions in hand so we can verify exactly what you are taking. This includes all medications from all doctors and over the counters  Note: May change micardis with next refill to - Micardis HCT 80-12.5    Follow Up Instructions:   Follow up tele-visit with Dr. Sherene Sires in 2 weeks or sooner if needed  I discussed the assessment and treatment plan with the patient. The patient was provided an opportunity to ask questions and all were answered. The  patient agreed with the plan and demonstrated an understanding of the instructions.   The patient was advised to call back or seek an in-person evaluation if the symptoms worsen or if the condition fails to improve as anticipated.  I provided 23 minutes of non-face-to-face time during this encounter.   Ivonne Andrew, NP

## 2018-11-20 NOTE — Patient Instructions (Addendum)
Please take micardis every morning Please take lopressor twice daily Check blood pressure and pulse each morning 2 hours after taking medications and keep log Low sodium diet Continue protonix as per your gi doctor  GERD (REFLUX)  is an extremely common cause of respiratory symptoms just like yours , many times with no obvious heartburn at all.    It can be treated with medication, but also with lifestyle changes including elevation of the head of your bed (ideally with 6 -8inch blocks under the headboard of your bed),  Smoking cessation, avoidance of late meals, excessive alcohol, and avoid fatty foods, chocolate, peppermint, colas, red wine, and acidic juices such as orange juice.  NO MINT OR MENTHOL PRODUCTS SO NO COUGH DROPS  USE SUGARLESS CANDY INSTEAD (Jolley ranchers or Stover's or Life Savers) or even ice chips will also do - the key is to swallow to prevent all throat clearing. NO OIL BASED VITAMINS - use powdered substitutes.  Avoid fish oil when coughing.    Please schedule a follow up office visit in 4 weeks, sooner if needed  with all medications /inhalers/ solutions in hand so we can verify exactly what you are taking. This includes all medications from all doctors and over the counters  Note: May change micardis with next refill to - Micardis HCT 80-12.5  Follow up: Follow up tele-visit with Dr. Sherene Sires in 2 weeks or sooner if needed

## 2018-11-23 ENCOUNTER — Encounter: Payer: Self-pay | Admitting: Nurse Practitioner

## 2018-11-23 DIAGNOSIS — R053 Chronic cough: Secondary | ICD-10-CM | POA: Insufficient documentation

## 2018-11-23 DIAGNOSIS — R05 Cough: Secondary | ICD-10-CM | POA: Insufficient documentation

## 2018-11-23 NOTE — Assessment & Plan Note (Signed)
Discussion: At last visit she was started on micardis 80 mg Lopressor daily.  She states that she has been doing well since last visit.  She is concerned because her blood pressures have been fluctuating.  States that she has taken Micardis at night and Lopressor twice daily.  She is checking her blood pressures at random times throughout the day.  We discussed that it might be better for her to take the Micardis in the morning first thing.  She can still take Lopressor twice daily.  Check blood pressures consistently every morning 2 hours after taking medications.  Please keep a log for follow-up visit.  Patient Instructions  Please take micardis every morning Please take lopressor twice daily Check blood pressure and pulse each morning 2 hours after taking medications and keep log Low sodium diet Continue protonix as per your gi doctor  GERD (REFLUX)  is an extremely common cause of respiratory symptoms just like yours , many times with no obvious heartburn at all.    It can be treated with medication, but also with lifestyle changes including elevation of the head of your bed (ideally with 6 -8inch blocks under the headboard of your bed),  Smoking cessation, avoidance of late meals, excessive alcohol, and avoid fatty foods, chocolate, peppermint, colas, red wine, and acidic juices such as orange juice.  NO MINT OR MENTHOL PRODUCTS SO NO COUGH DROPS  USE SUGARLESS CANDY INSTEAD (Jolley ranchers or Stover's or Life Savers) or even ice chips will also do - the key is to swallow to prevent all throat clearing. NO OIL BASED VITAMINS - use powdered substitutes.  Avoid fish oil when coughing.    Please schedule a follow up office visit in 4 weeks, sooner if needed  with all medications /inhalers/ solutions in hand so we can verify exactly what you are taking. This includes all medications from all doctors and over the counters  Note: May change micardis with next refill to - Micardis HCT  80-12.5  Follow up: Follow up tele-visit with Dr. Sherene Sires in 2 weeks or sooner if needed

## 2018-11-24 NOTE — Progress Notes (Signed)
Chart and office note reviewed in detail  > agree with a/p as outlined    

## 2018-12-07 ENCOUNTER — Ambulatory Visit (INDEPENDENT_AMBULATORY_CARE_PROVIDER_SITE_OTHER): Payer: 59 | Admitting: Internal Medicine

## 2018-12-07 ENCOUNTER — Other Ambulatory Visit: Payer: Self-pay

## 2018-12-07 ENCOUNTER — Encounter: Payer: Self-pay | Admitting: Internal Medicine

## 2018-12-07 DIAGNOSIS — R918 Other nonspecific abnormal finding of lung field: Secondary | ICD-10-CM | POA: Diagnosis not present

## 2018-12-07 DIAGNOSIS — I10 Essential (primary) hypertension: Secondary | ICD-10-CM

## 2018-12-07 DIAGNOSIS — T464X5A Adverse effect of angiotensin-converting-enzyme inhibitors, initial encounter: Secondary | ICD-10-CM

## 2018-12-07 NOTE — Patient Instructions (Addendum)
For drainage / throat tickle try replace benadryl with  CHLORPHENIRAMINE  4 mg  (Chlortab 4mg   at [employer] or [employer] should be easiest to find in the green box)  take one every 4 hours as needed - available over the counter- may cause drowsiness so start with just a bedtime dose or two and see how you tolerate it before trying in daytime  Try not to clear your throat - keep the candy handy but not mints/menthols/chocolates/ cough drops  To get the most out of exercise, you need to be continuously aware that you are short of breath, but never out of breath, for 30 minutes daily. As you improve, it will actually be easier for you to do the same amount of exercise  in  30 minutes so always push to the level where you are a little short of breath.    Follow up for the ct chest planned for 03/2019 and we will be in touch about this    If you are satisfied with your treatment plan,  let your doctor know and he/she can either refill your medications or you can return here when your prescription runs out.     If in any way you are not 100% satisfied,  please tell us.  If 100% better, tell your friends!  Pulmonary follow up is as needed   (has my chart so no need to mail these instructions)

## 2018-12-07 NOTE — Assessment & Plan Note (Signed)
See CT chest 10/14/18 > rec repeat in 04/14/2019> placed in reminder file  Can f/u this problem over the phone as very unlikely has significance here.

## 2018-12-07 NOTE — Progress Notes (Signed)
Kelsey Lang, female    DOB: 12-06-1965,  MRN: [id number]   Brief patient profile:  [id number] yowf never smoker with HELP/ high cardiolipins  post partum 2001 on asa 81 mg "prn" no longer followed by hematology or renal with severe gerd requiring pantoprazole complicated by  Es sticture req dilation  Last previous  around 2018 [place] started ACEi 07/2018 with recurrent dysphagia felt like bread crumbs were getting stuck to point where over a few days developed sensatoin "can't swallow spit so underwent emergency EGD 10/09/18 and swallowing improved some but  then abrupt sob/cp /  > [facility] 10/14/2018 and mpn none larger than 6mm so rec restart baby asa and referred to pulmonary clinic 10/22/2018 by  Dr Rochele RaringKristen Ward .  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 Pulmonary Consult    Referred by Dr. Baxter HireKristen Ward for eval of pulmonary nodules. Pt c/o SOB since had esophageal dilation 10/13/2018. She was SOB walking from our parking lot to our building today. She also c/o chest tightness.   Dyspnea:  Across a parking lot x years  Cough: not now  but feels something is stuck in throat points just below SS notch and gen chest feels tight/ uncomfortable - has not used carafate as rec  Sleep: one pillow fine   rec Continue protonix as per your gi doctor As needed carate slurry up to 4 x daily for chest pain Stop lisinopril and start micardis 80 mg one daily in addition to the metaprolol but ok to double metaprolol if bp not optimal  GERD diet    11/20/2018 NP televist: rec Please take micardis every morning Please take lopressor twice daily Check blood pressure and pulse each morning 2 hours after taking medications and keep log Low sodium diet Continue protonix as per your gi doctor GERD  Diet   Note: May change micardis  with next refill to - Micardis HCT 80-12.5   Virtual Visit via Telephone Note 12/07/2018 re UACS/ hbp  I connected with Kelsey Lang on 12/07/18 at  1:30 PM EDT by telephone and verified that I am speaking with the correct person using two identifiers.   I discussed the limitations, risks, security and privacy concerns of performing an evaluation and management service by telephone and the availability of in person appointments. I also discussed with the patient that there may be a patient responsible charge related to this service. The patient expressed understanding and agreed to proceed.   History of Present Illness: Working in [employer] no exercise at all since onset of symptoms, still feels can't always get a deep breath at rest when wants one, sense of globus though is much better  Dyspnea:  Does fine with activity, not aware of limiting sob but not active Cough: still clearing throat some day, never noct Sleeping: restless / no resp symptoms SABA use: none 02: none  Has    No obvious day to day or daytime variability or assoc excess/ purulent sputum or mucus plugs or hemoptysis or cp or chest tightness, subjective wheeze or overt sinus or hb symptoms.    Also denies any obvious fluctuation of symptoms with weather or environmental changes or other aggravating or alleviating factors except as outlined above.   Meds reviewed/ med reconciliation  completed          Observations/Objective: Sounds great, speaking in full sentences/ positive outlook, min throat clearing    Assessment and Plan: See problem list for active a/p's   Follow Up Instructions: See avs for instructions unique to this ov which includes revised/ updated med list     I discussed the assessment and treatment plan with the patient. The patient was provided an opportunity to ask questions and all were answered. The patient agreed with the plan and demonstrated an understanding of the instructions.    The patient was advised to call back or seek an in-person evaluation if the symptoms worsen or if the condition fails to improve as anticipated.  I provided 22 minutes of non-face-to-face time during this encounter.   Sandrea Hughs, MD

## 2018-12-07 NOTE — Assessment & Plan Note (Signed)
Changed acei to arb 10/22/2018 due to cough and dysphagia/ globus sensation > 95% better 12/07/2018   Although even in retrospect it may not be clear the ACEi contributed to the pt's symptoms,  Pt improved off them and adding them back at this point or in the future would risk confusion in interpretation of non-specific respiratory symptoms to which this patient is prone  ie  Better not to muddy the waters here.    Doing fine on micardis 80 mg one daily > pulmonary f/u is prn

## 2018-12-07 NOTE — Assessment & Plan Note (Addendum)
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   These are the same pts (now being labeled as having "irritable larynx syndrome" by some cough centers) who not infrequently have a history of having failed to tolerate ace inhibitors,  dry powder inhalers or biphosphonates or report having atypical/extraesophageal reflux symptoms that don't respond to standard doses of PPI  and are easily confused as having aecopd or asthma flares by even experienced allergists/ pulmonologists (myself included).   rec stay off acei (see separate a/p) Titrate off gerd rx per GI f/u in [place] 1st gen H1 blockers per guidelines  Instead of brenadryl Reconditioning needed for sense of sob and then cpst only if fails to reach back to baseline off acei Pulmonary f/u prn

## 2019-04-14 ENCOUNTER — Other Ambulatory Visit: Payer: Self-pay | Admitting: Internal Medicine

## 2019-04-14 DIAGNOSIS — R911 Solitary pulmonary nodule: Secondary | ICD-10-CM

## 2019-05-07 ENCOUNTER — Inpatient Hospital Stay: Admission: RE | Admit: 2019-05-07 | Payer: 59 | Source: Ambulatory Visit

## 2019-06-04 ENCOUNTER — Other Ambulatory Visit: Payer: Self-pay

## 2019-06-04 ENCOUNTER — Ambulatory Visit (INDEPENDENT_AMBULATORY_CARE_PROVIDER_SITE_OTHER)
Admission: RE | Admit: 2019-06-04 | Discharge: 2019-06-04 | Disposition: A | Discharge status: Home / Self Care | Payer: 59 | Source: Ambulatory Visit | Attending: Internal Medicine | Admitting: Internal Medicine

## 2019-06-04 DIAGNOSIS — R911 Solitary pulmonary nodule: Secondary | ICD-10-CM

## 2019-09-21 ENCOUNTER — Emergency Department (HOSPITAL_COMMUNITY)
Admission: EM | Admit: 2019-09-21 | Discharge: 2019-09-22 | Disposition: A | Discharge status: Home / Self Care | Payer: BC Managed Care – PPO | Attending: Emergency Medicine | Admitting: Emergency Medicine

## 2019-09-21 ENCOUNTER — Encounter (HOSPITAL_COMMUNITY): Payer: Self-pay | Admitting: Emergency Medicine

## 2019-09-21 ENCOUNTER — Emergency Department (HOSPITAL_COMMUNITY): Payer: BC Managed Care – PPO

## 2019-09-21 ENCOUNTER — Other Ambulatory Visit: Payer: Self-pay

## 2019-09-21 DIAGNOSIS — R260 Ataxic gait: Secondary | ICD-10-CM | POA: Diagnosis not present

## 2019-09-21 DIAGNOSIS — R270 Ataxia, unspecified: Secondary | ICD-10-CM

## 2019-09-21 DIAGNOSIS — R4701 Aphasia: Secondary | ICD-10-CM

## 2019-09-21 DIAGNOSIS — D689 Coagulation defect, unspecified: Secondary | ICD-10-CM | POA: Diagnosis not present

## 2019-09-21 DIAGNOSIS — R519 Headache, unspecified: Secondary | ICD-10-CM | POA: Diagnosis present

## 2019-09-21 DIAGNOSIS — Z79899 Other long term (current) drug therapy: Secondary | ICD-10-CM | POA: Insufficient documentation

## 2019-09-21 DIAGNOSIS — R112 Nausea with vomiting, unspecified: Secondary | ICD-10-CM | POA: Insufficient documentation

## 2019-09-21 DIAGNOSIS — I10 Essential (primary) hypertension: Secondary | ICD-10-CM | POA: Diagnosis not present

## 2019-09-21 LAB — CBC
HCT: 43.7 % (ref 36.0–46.0)
Hemoglobin: 14.1 g/dL (ref 12.0–15.0)
MCH: 29.4 pg (ref 26.0–34.0)
MCHC: 32.3 g/dL (ref 30.0–36.0)
MCV: 91.2 fL (ref 80.0–100.0)
Platelets: 321 10*3/uL (ref 150–400)
RBC: 4.79 MIL/uL (ref 3.87–5.11)
RDW: 13.4 % (ref 11.5–15.5)
WBC: 7.1 10*3/uL (ref 4.0–10.5)
nRBC: 0 % (ref 0.0–0.2)

## 2019-09-21 LAB — BASIC METABOLIC PANEL
Anion gap: 14 (ref 5–15)
BUN: 7 mg/dL (ref 6–20)
CO2: 22 mmol/L (ref 22–32)
Calcium: 9.7 mg/dL (ref 8.9–10.3)
Chloride: 102 mmol/L (ref 98–111)
Creatinine, Ser: 0.86 mg/dL (ref 0.44–1.00)
GFR calc Af Amer: >60 mL/min (ref >60)
GFR calc non Af Amer: >60 mL/min (ref >60)
Glucose, Bld: 101 mg/dL — ABNORMAL HIGH (ref 70–99)
Potassium: 3.7 mmol/L (ref 3.5–5.1)
Sodium: 138 mmol/L (ref 135–145)

## 2019-09-21 LAB — PROTIME-INR
INR: 1.1 (ref 0.8–1.2)
Prothrombin Time: 13.8 seconds (ref 11.4–15.2)

## 2019-09-21 LAB — ETHANOL: Alcohol, Ethyl (B): <10 mg/dL (ref <10)

## 2019-09-21 NOTE — ED Triage Notes (Addendum)
Pt presents to ED POV sent by PCP @ [facility]. Pt sent here to have CT of head after having headache, slurred speech, unsteady gait. sysmptoms began 3 days ago and have resolved by now. No neuro deficits noted in triage. Pt NAD, walking with steady gait.

## 2019-09-21 NOTE — ED Provider Notes (Signed)
[facility] EMERGENCY DEPARTMENT Provider Note   CSN: [id number] Arrival date & time: 09/21/19  1855     History Chief Complaint  Patient presents with  . Stroke Symptoms    Kelsey Lang is a 54 y.o. female with a history of anticardiolipin antibody IgM, migraines, and chronic low back pain who presents to the emergency department with a chief complaint of ataxia.  The patient reports that she has had a right sided headache for the last 3 days.  Headache began gradually and has been constant since onset.  She reports that this is typically where her usual migraines are located.  However, 2 days ago she reports that she became "disoriented" and began forgetting information that she normally knows.  For instance, she states that she could not remember the code to unlock her phone earlier today.  She also endorses word finding difficulty.  She reports that she knew her name, but was forgetting other words that she commonly knew.  She also endorses ataxia.  She denies any weakness or numbness, but reports that she felt "intoxicated" and was able to walk, but had to hold onto the wall or the railing.  She reports that she was able to make it down the stairs from her second-floor apartment earlier today to her neighbor's apartment after she was unable to recall the code on her phone to unlock it.  She was then taken to [facility] medical clinic and was given a "medication that goes under my tongue that starts with an N, but isn't nitroglycerin."  She reports that all of her symptoms except for her headache and nausea completely resolved.  However, headache is significantly improved from previous.  She denies chest pain, shortness of breath, cough, numbness, weakness, facial droop,  She has also been having nausea and slurred speech, but states that these symptoms are common when she has her typical migraines.  She has not been seen by neurology since 2000.  She is currently on  Topamax and gabapentin that are managed by her primary care provider.  The history is provided by the patient. No language interpreter was used.       Past Medical History:  Diagnosis Date  . 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 Chronic back pain   . Migraine    accompanied by speech difficulties "since I was 54 years old"    Patient Active Problem List   Diagnosis Date Noted  . Chronic cough 11/23/2018  . Adverse reaction to ACE inhibitor drug, initial encounter 10/23/2018  . Essential hypertension 10/23/2018  . Multiple lung nodules on CT 10/23/2018  . GERD with stricture 10/23/2018    Past Surgical History:  Procedure Laterality Date  . ABDOMINAL HYSTERECTOMY       OB History   No obstetric history on file.     Family History  Problem Relation Age of Onset  . Breast cancer Mother 85  . Stroke Paternal Grandmother     Social History   Tobacco Use  . Smoking status: Never Smoker  . Smokeless tobacco: Never Used  Substance Use Topics  . Alcohol use: No  . Drug use: No    Home Medications Prior to Admission medications   Medication Sig Start Date End Date Taking? Authorizing Provider  gabapentin (NEURONTIN) 100 MG capsule Take 100 mg by mouth 3 (three) times daily.    [provider]  meclizine (ANTIVERT) 25 MG tablet Take 1 tablet (25  mg total) by mouth 3 (three) times daily as needed for dizziness. 10/14/18   Alexzandra Bilton A, PA-C  metoprolol tartrate (LOPRESSOR) 25 MG tablet Take 25 mg by mouth 2 (two) times daily.    [provider]  ondansetron (ZOFRAN ODT) 4 MG disintegrating tablet Take 1 tablet (4 mg total) by mouth every 8 (eight) hours as needed. 09/22/19   Hydee Fleece A, PA-C  pantoprazole (PROTONIX) 40 MG tablet Take 80 mg by mouth 2 (two) times daily. 09/24/18   [provider]  promethazine (PHENERGAN) 25 MG tablet Take 1 tablet (25 mg total) by mouth every 6 (six) hours as needed for nausea or  vomiting. 10/14/18   Emmalyne Giacomo A, PA-C  sucralfate (CARAFATE) 1 g tablet Take 1 tablet (1 g total) by mouth 4 (four) times daily for 14 days. 10/14/18 10/28/18  Kylyn Mcdade A, PA-C  telmisartan (MICARDIS) 80 MG tablet Take 1 tablet (80 mg total) by mouth daily. 10/22/18   Nyoka Cowden, MD    Allergies    Penicillins, Acetaminophen, and Sulfa antibiotics  Review of Systems   Review of Systems  Constitutional: Negative for activity change, chills, diaphoresis and fever.  Respiratory: Negative for shortness of breath.   Cardiovascular: Negative for chest pain.  Gastrointestinal: Positive for nausea and vomiting. Negative for abdominal pain, anal bleeding, blood in stool, constipation and diarrhea.  Genitourinary: Negative for dysuria, frequency, hematuria and urgency.  Musculoskeletal: Negative for back pain, joint swelling, myalgias, neck pain and neck stiffness.  Skin: Negative for rash.  Allergic/Immunologic: Negative for immunocompromised state.  Neurological: Positive for speech difficulty and headaches. Negative for dizziness, tremors, seizures, syncope, facial asymmetry, weakness, light-headedness and numbness.       Ataxia, aphasia  Psychiatric/Behavioral: Negative for confusion.    Physical Exam Updated Vital Signs BP 140/81   Pulse 83   Temp 98.4 F (36.9 C) (Oral)   Resp 16   SpO2 91%   Physical Exam Vitals and nursing note reviewed.  Constitutional:      General: She is not in acute distress.    Appearance: She is not ill-appearing, toxic-appearing or diaphoretic.     Comments: Well-appearing.  No acute distress.  HENT:     Head: Normocephalic.  Eyes:     Extraocular Movements: Extraocular movements intact.     Conjunctiva/sclera: Conjunctivae normal.     Pupils: Pupils are equal, round, and reactive to light.  Cardiovascular:     Rate and Rhythm: Normal rate and regular rhythm.     Heart sounds: No murmur. No friction rub. No gallop.   Pulmonary:      Effort: Pulmonary effort is normal. No respiratory distress.     Breath sounds: No stridor. No wheezing, rhonchi or rales.  Chest:     Chest wall: No tenderness.  Abdominal:     General: There is no distension.     Palpations: Abdomen is soft. There is no mass.     Tenderness: There is no abdominal tenderness. There is no right CVA tenderness, left CVA tenderness, guarding or rebound.     Hernia: No hernia is present.  Musculoskeletal:     Cervical back: Neck supple.  Skin:    General: Skin is warm.     Findings: No rash.  Neurological:     Mental Status: She is alert and oriented to person, place, and time.     Comments: Mental Status: Patient is awake, alert, oriented to person, place, month, year, and situation.  Patient is able to give a clear and coherent history. Cranial Nerves: II: Visual Fields are full. Pupils are equal, round, and reactive to light. III,IV, VI: EOMI without ptosis or diploplia.  V: Facial sensation is symmetric to temperature VII: Facial movement is symmetric.  VIII: hearing is intact to voice X: Uvula elevates symmetrically XI: Shoulder shrug is symmetric. XII: tongue is midline without atrophy or fasciculations.  Motor: Tone is normal. Bulk is normal. 5/5 strength was present in all four extremities.  Sensory: Sensation is symmetric to light touch in the arms and legs.  No dysmetria with finger-to-nose or heel-to-shin bilaterally. No clonus bilaterally. Negative Romberg. No pronator drift. Ambulates without ataxia.  Psychiatric:        Behavior: Behavior normal.     ED Results / Procedures / Treatments   Labs (all labs ordered are listed, but only abnormal results are displayed) Labs Reviewed  BASIC METABOLIC PANEL - Abnormal; Notable for the following components:      Result Value   Glucose, Bld 101 (*)    All other components within normal limits  CBC  PROTIME-INR  ETHANOL    EKG None  Radiology CT Head Wo Contrast  Result  Date: 09/21/2019 CLINICAL DATA:  Headaches and slurred speech for several days EXAM: CT HEAD WITHOUT CONTRAST TECHNIQUE: Contiguous axial images were obtained from the base of the skull through the vertex without intravenous contrast. COMPARISON:  Prior CT from 02/05/2013 and MRI of the brain from 10/14/2018 FINDINGS: Brain: No acute hemorrhage or space-occupying mass lesion is noted. Vague decreased attenuation is noted in the occipital lobes bilaterally in a somewhat symmetrical fashion. Vascular: No hyperdense vessel or unexpected calcification. Skull: Normal. Negative for fracture or focal lesion. Sinuses/Orbits: No acute finding. Other: None. IMPRESSION: Vague decreased attenuation in the occipital lobes bilaterally. The bilateral nature suggests this may be within normal limits. Correlation with the clinical symptomatology is recommended. Need for further imaging can be determined on a clinical basis. Electronically Signed   By: Alcide Clever M.D.   On: 09/21/2019 22:37    Procedures Procedures (including critical care time)  Medications Ordered in ED Medications  ketorolac (TORADOL) 30 MG/ML injection 30 mg (30 mg Intravenous Given 09/22/19 0045)  sodium chloride 0.9 % bolus 1,000 mL (0 mLs Intravenous Stopped 09/22/19 0241)  prochlorperazine (COMPAZINE) injection 10 mg (10 mg Intravenous Given 09/22/19 0042)  diphenhydrAMINE (BENADRYL) injection 25 mg (25 mg Intravenous Given 09/22/19 0039)  dexamethasone (DECADRON) injection 10 mg (10 mg Intravenous Given 09/22/19 0140)    ED Course  I have reviewed the triage vital signs and the nursing notes.  Pertinent labs & imaging results that were available during my care of the patient were reviewed by me and considered in my medical decision making (see chart for details).    MDM Rules/Calculators/A&P                      54 year old female history of anticardiolipin antibody IgM, migraines, and chronic low back pain who presents to the emergency  department with headache for the last 3 days accompanied by nausea and vomiting.  She also developed slurred speech, aphasia, and ataxia that reserved earlier today after she was seen at urgent care and treated with an unknown medication.  In the ER, vital signs are normal.  Comprehensive neuro exam with no focal deficits.  Her only complaint is headache and nausea at this time.  The patient's medical record was reviewed in detail.  I am actually familiar with this patient as I treated her for similar symptoms about a year ago.  Initially, the patient states that she has never had nominal aphasia with previous migraines, but it appears that she was seen for the same at an ER visit in 2014.  The patient has not been seen by neurology since 2000 and her home Topamax and gabapentin are managed by primary care.  After I saw her in the ER last year, she was advised to follow-up with neurology, but unfortunately did not.  The patient was also seen and evaluated by Dr. Christy Gentles, attending physician.  Could consider TIA, but her neurologic deficits were persistent for more than 48 hours.  CT head is negative for stroke.  Her labs have been reviewed and are unremarkable.  I have a low suspicion for venous thrombosis although she does have a history of anticardiolipin antibody.  Could also consider complex migraine since the patient has had similar presentations previously.  We discussed repeating an MRI in the ER as occult stroke could not be ruled out.  Patient declines as she suspects this is all secondary to her migraine.  She is adamant that she will follow up with neurology on an outpatient basis.  Following migraine cocktail with Toradol Compazine and Benadryl, headache significantly improved.  Headache fully resolved after Decadron was added.  The patient is feeling much better and she would like to be discharged to home.  She was given strict return precautions to the ER.  Vital signs remain stable.  Safe for  discharge with outpatient neurology follow-up.  Final Clinical Impression(s) / ED Diagnoses Final diagnoses:  Acute nonintractable headache, unspecified headache type  Ataxia  Aphasia  Nausea and vomiting, intractability of vomiting not specified, unspecified vomiting type     Rx / DC Orders ED Discharge Orders         Ordered    ondansetron (ZOFRAN ODT) 4 MG disintegrating tablet  Every 8 hours PRN     09/22/19 0158           Priyansh Pry A, PA-C 09/22/19 4081    Ripley Fraise, MD 09/23/19 [phone]

## 2019-09-22 MED ORDER — SODIUM CHLORIDE 0.9 % IV BOLUS
1000.0000 mL | Freq: Once | INTRAVENOUS | Status: AC
  Administered 2019-09-22: 01:00:00 1000 mL via INTRAVENOUS

## 2019-09-22 MED ORDER — KETOROLAC TROMETHAMINE 30 MG/ML IJ SOLN
30.0000 mg | Freq: Once | INTRAMUSCULAR | Status: AC
  Administered 2019-09-22: 30 mg via INTRAVENOUS
  Filled 2019-09-22: qty 1

## 2019-09-22 MED ORDER — DEXAMETHASONE SODIUM PHOSPHATE 10 MG/ML IJ SOLN
10.0000 mg | Freq: Once | INTRAMUSCULAR | Status: AC
  Administered 2019-09-22: 10 mg via INTRAVENOUS
  Filled 2019-09-22: qty 1

## 2019-09-22 MED ORDER — PROCHLORPERAZINE EDISYLATE 10 MG/2ML IJ SOLN
10.0000 mg | Freq: Once | INTRAMUSCULAR | Status: AC
  Administered 2019-09-22: 01:00:00 10 mg via INTRAVENOUS
  Filled 2019-09-22: qty 2

## 2019-09-22 MED ORDER — ONDANSETRON 4 MG PO TBDP: 4.0000 mg | ORAL_TABLET | Freq: Three times a day (TID) | ORAL | 0 refills | Status: AC | PRN

## 2019-09-22 MED ORDER — DIPHENHYDRAMINE HCL 50 MG/ML IJ SOLN
25.0000 mg | Freq: Once | INTRAMUSCULAR | Status: AC
  Administered 2019-09-22: 01:00:00 25 mg via INTRAVENOUS
  Filled 2019-09-22: qty 1

## 2019-09-22 NOTE — Discharge Instructions (Addendum)
Thank you for allowing me to care for you today in the Emergency Department.   An MRI was recommended in the ER today, but you declined.  It is very important that you follow-up with neurology since you have not seen a neurologist and many years.  You can continue to take your home gabapentin and Topamax unless advised differently by neurology.  Let 1 tablet of Zofran dissolve in your tongue every 8 hours as needed for nausea or vomiting.  Return to the emergency department if you develop a sudden, severe headache with new numbness, weakness, slurred speech, facial droop, confusion, or other new, concerning symptoms.

## 2019-09-22 NOTE — ED Notes (Signed)
Provider bedside.

## 2019-09-22 NOTE — ED Provider Notes (Signed)
Patient seen/examined in the Emergency Department in conjunction with Midlevel Provider McDonald Patient reports migraine headache as well as slurred speech and difficulty walking.  She reports all of her neurologic symptoms have improved and she only has a headache Exam : Awake alert, no acute distress.  Speech is fluent.  No arm or leg drift. Plan: Due to CT findings of previous abnormal MRI, I advised MRI brain. Patient reports that she feels this is likely all due to her migraine and she declines MRI at this time. I told the patient that I am unable to rule out an occult stroke without MRI brain.  Patient understands this and still declines further testing.  She reports she will follow-up as an outpatient.  She was told that she can return if there is any worsening symptoms or weakness in the next several days   Kelsey Rhine, MD 09/22/19 [phone]

## 2020-05-30 IMAGING — CT CT ANGIO CHEST
2 of 7 series · 18 of 46 positions shown · IV contrast (APPLIED)
Comparison: Chest radiograph October 13, 2018

CLINICAL DATA: Chest pain and shortness of breath. History of
clotting disorder.

EXAM:
CT ANGIOGRAPHY CHEST WITH CONTRAST
TECHNIQUE: Multidetector CT imaging of the chest was performed using the
standard protocol during bolus administration of intravenous
contrast. Multiplanar CT image reconstructions and MIPs were
obtained to evaluate the vascular anatomy.
CONTRAST:  80mL 4YIE7Q-RAI IOPAMIDOL ([id number]-RAI) INJECTION 76%

[Series 7: thins · axial · 0.72mm/px · z∈[+947,+1194]mm · 15 of 396 slices shown]
[im 22/396  lung]
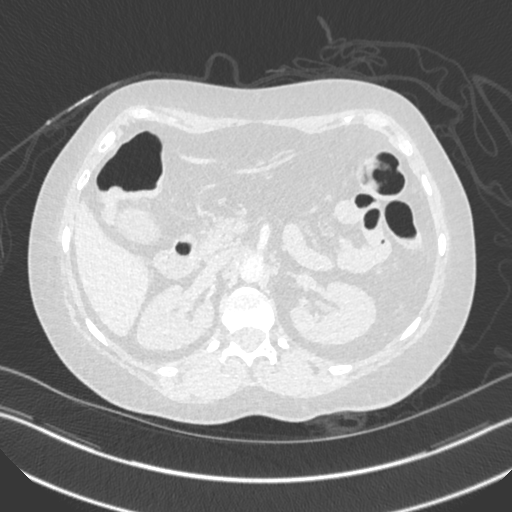
[im 44/396  soft-tissue]
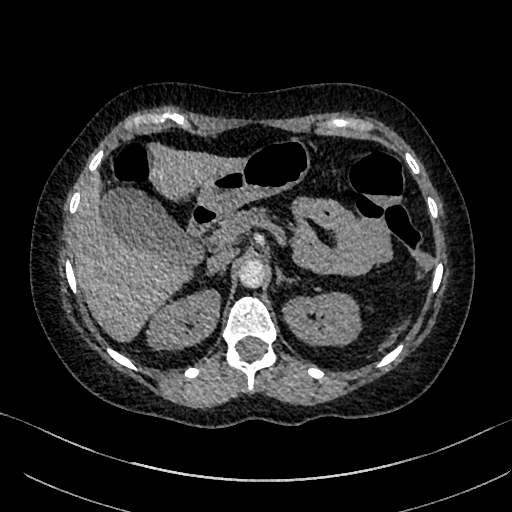
[im 66/396  lung]
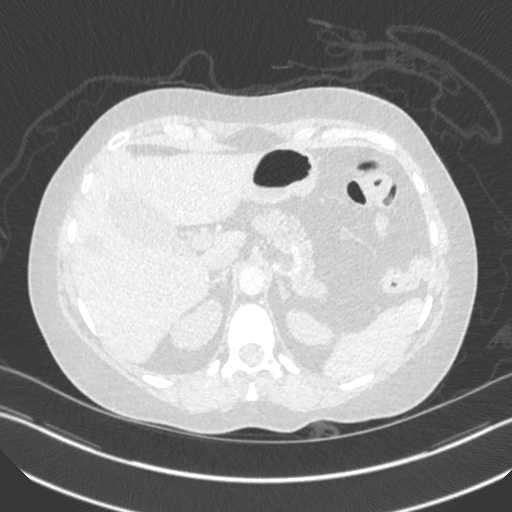
[im 88/396  soft-tissue]
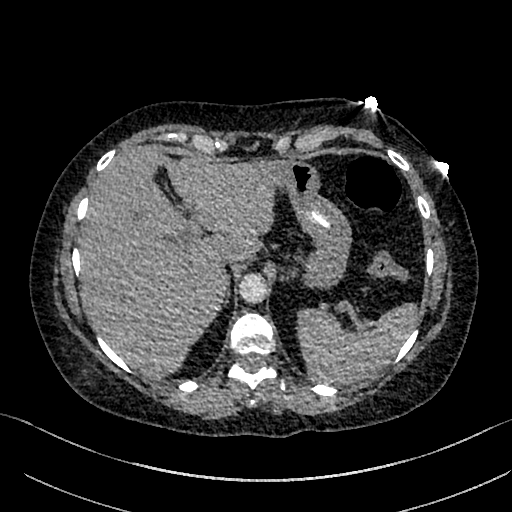
[im 132/396  lung]
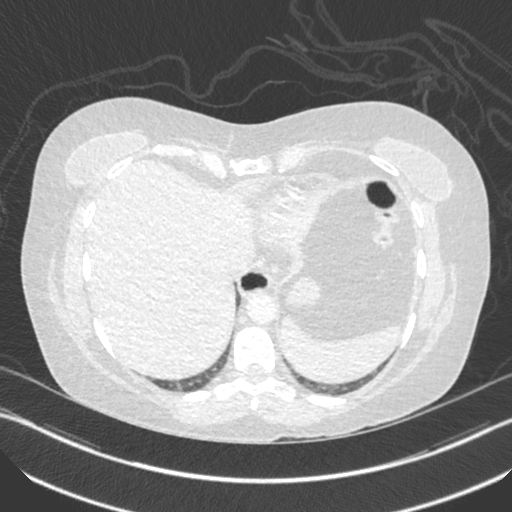
[im 154/396  soft-tissue]
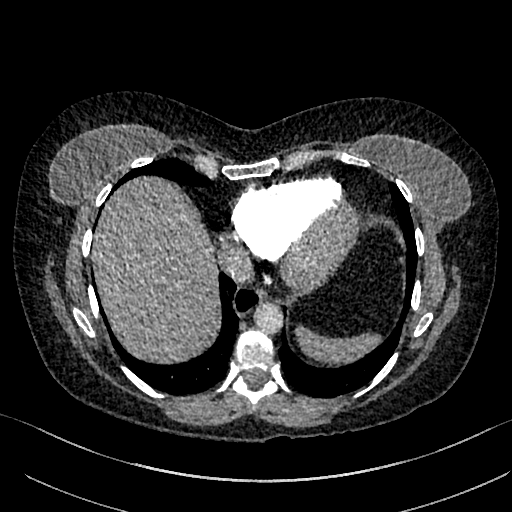
[im 176/396  lung]
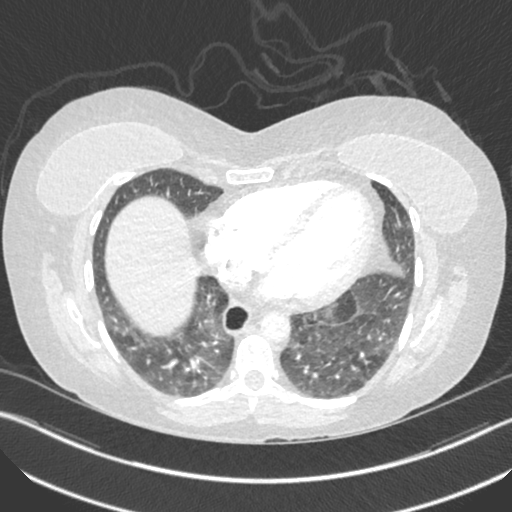
[im 198/396  soft-tissue]
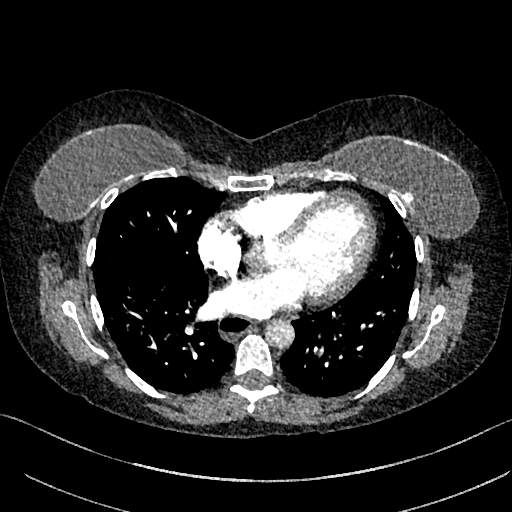
[im 220/396  lung]
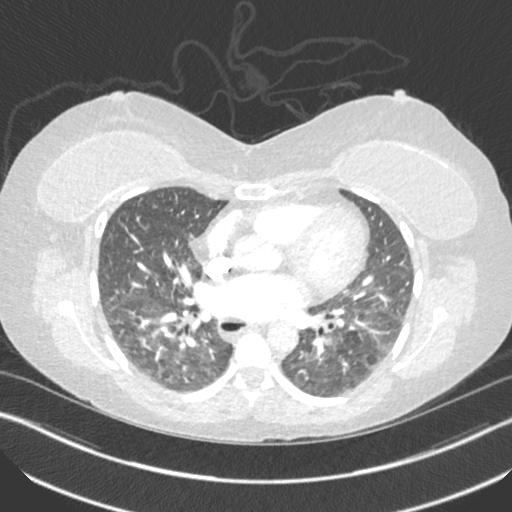
[im 242/396  soft-tissue]
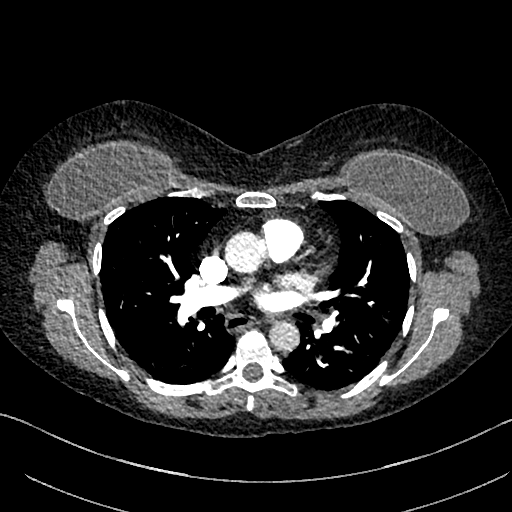
[im 264/396  lung]
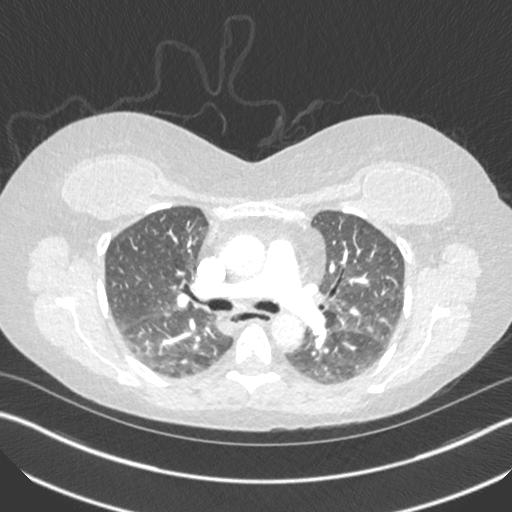
[im 308/396  soft-tissue]
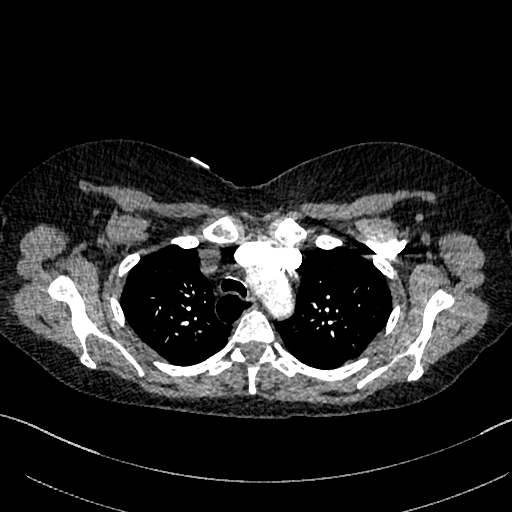
[im 330/396  lung]
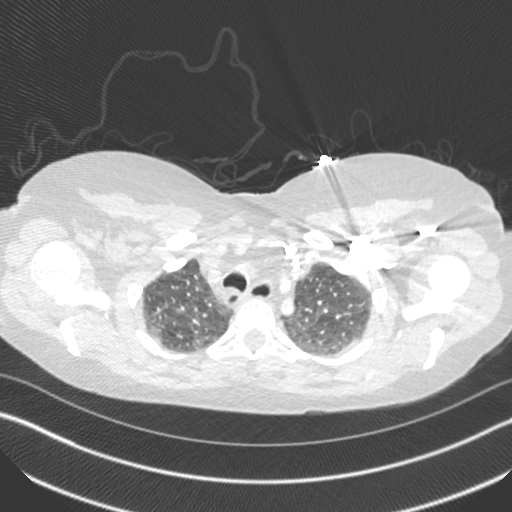
[im 352/396  soft-tissue]
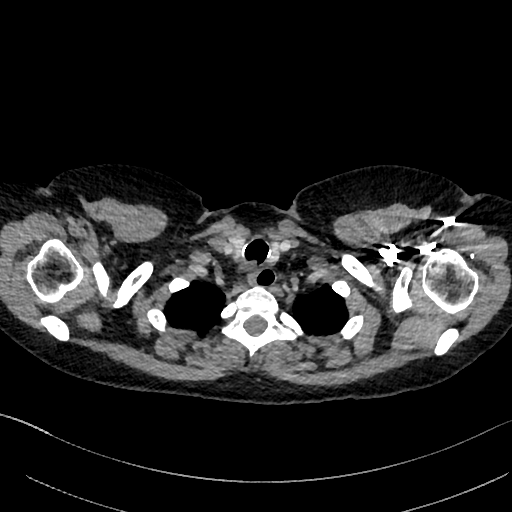
[im 374/396  lung]
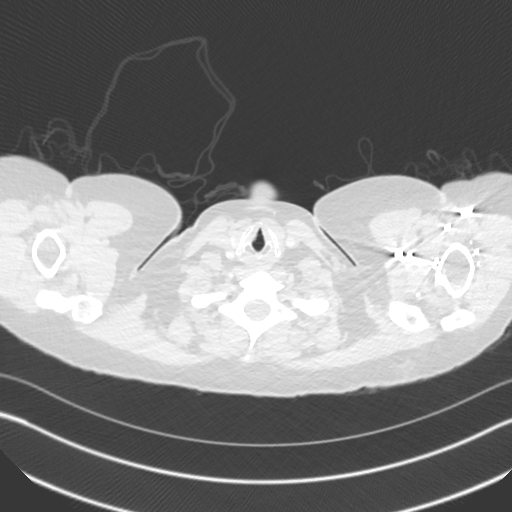

[Series 8: cor · coronal · 0.55mm/px · 3 of 128 slices shown]
[im 32/128  soft-tissue]
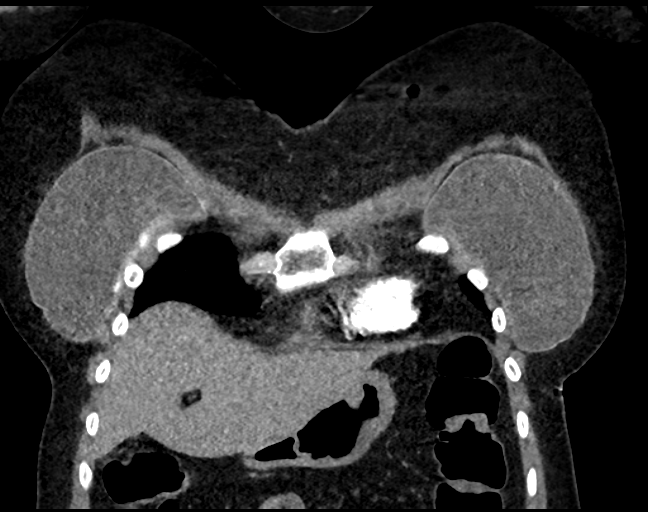
[im 64/128  soft-tissue]
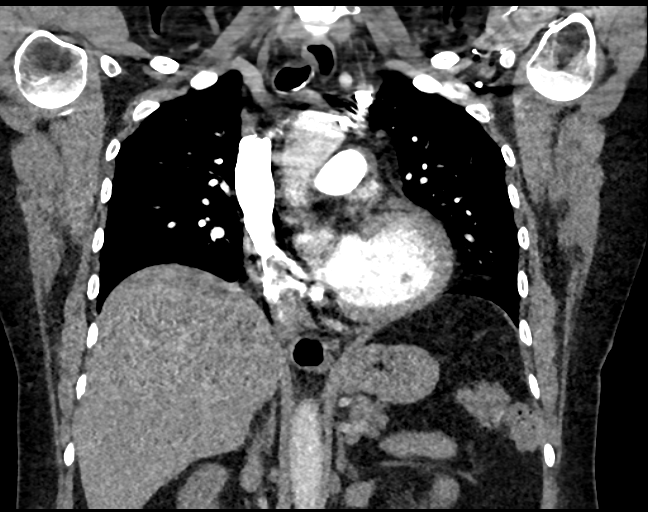
[im 96/128  soft-tissue]
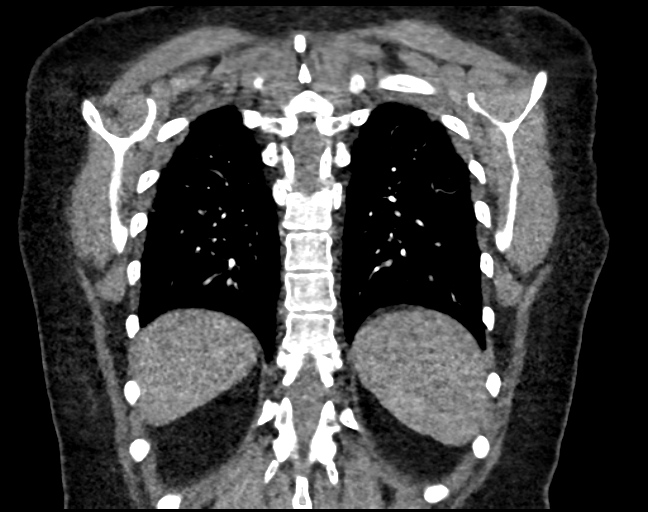

[18 of 46 positions shown; findings below may reference images not displayed]

FINDINGS: CARDIOVASCULAR: Adequate contrast opacification of the pulmonary
artery's. Main pulmonary artery is not enlarged. No pulmonary
arterial filling defects to the level of the subsegmental branches.
Heart size is normal, no right heart strain. No pericardial
effusion. Thoracic aorta is normal course and caliber, unremarkable.

MEDIASTINUM/NODES: No lymphadenopathy by CT size criteria. Mildly
patulous esophagus.

LUNGS/PLEURA: Tracheobronchial tree is patent, no pneumothorax. Mild
bronchial wall thickening. Mosaic attenuation. Multiple RIGHT upper
lobe subsolid pulmonary nodules measuring to 6 mm (series 6, image
56).

UPPER ABDOMEN: Non-acute.  17 mm cyst RIGHT lobe of the liver.

MUSCULOSKELETAL: Non-acute.  Bilateral calcified breast implants.

Review of the MIP images confirms the above findings.
IMPRESSION: 1. No acute pulmonary embolism.
2. Mosaic attenuation seen with small airway disease or pulmonary
edema. Mild bronchial wall thickening seen with bronchitis or
reactive airway disease. No pneumonia.
3. **An incidental finding of potential clinical significance has
been found. Multiple subsolid pulmonary nodules measuring to 6 mm.
Non-contrast chest CT at 3-6 months is recommended. If nodules
persist, subsequent management will be based upon the most
suspicious nodule(s). This recommendation follows the consensus
statement: Guidelines for Management of Incidental Pulmonary Nodules
Detected on CT Images: From the [HOSPITAL] 3792; Radiology
3792; [DATE]. **

## 2023-01-24 ENCOUNTER — Emergency Department (HOSPITAL_BASED_OUTPATIENT_CLINIC_OR_DEPARTMENT_OTHER)
Admission: EM | Admit: 2023-01-24 | Discharge: 2023-01-24 | Disposition: A | Discharge status: Home / Self Care | Payer: BC Managed Care – PPO | Attending: Emergency Medicine | Admitting: Emergency Medicine

## 2023-01-24 ENCOUNTER — Emergency Department (HOSPITAL_BASED_OUTPATIENT_CLINIC_OR_DEPARTMENT_OTHER): Payer: BC Managed Care – PPO

## 2023-01-24 ENCOUNTER — Other Ambulatory Visit: Payer: Self-pay

## 2023-01-24 ENCOUNTER — Other Ambulatory Visit (HOSPITAL_BASED_OUTPATIENT_CLINIC_OR_DEPARTMENT_OTHER): Payer: Self-pay

## 2023-01-24 DIAGNOSIS — R519 Headache, unspecified: Secondary | ICD-10-CM

## 2023-01-24 DIAGNOSIS — Z859 Personal history of malignant neoplasm, unspecified: Secondary | ICD-10-CM | POA: Diagnosis not present

## 2023-01-24 DIAGNOSIS — H5509 Other forms of nystagmus: Secondary | ICD-10-CM | POA: Insufficient documentation

## 2023-01-24 DIAGNOSIS — Z21 Asymptomatic human immunodeficiency virus [HIV] infection status: Secondary | ICD-10-CM | POA: Insufficient documentation

## 2023-01-24 DIAGNOSIS — R42 Dizziness and giddiness: Secondary | ICD-10-CM | POA: Diagnosis not present

## 2023-01-24 DIAGNOSIS — E878 Other disorders of electrolyte and fluid balance, not elsewhere classified: Secondary | ICD-10-CM | POA: Diagnosis not present

## 2023-01-24 LAB — CBC
HCT: 34.2 % — ABNORMAL LOW (ref 36.0–46.0)
Hemoglobin: 11.2 g/dL — ABNORMAL LOW (ref 12.0–15.0)
MCH: 29.4 pg (ref 26.0–34.0)
MCHC: 32.7 g/dL (ref 30.0–36.0)
MCV: 89.8 fL (ref 80.0–100.0)
Platelets: 278 10*3/uL (ref 150–400)
RBC: 3.81 MIL/uL — ABNORMAL LOW (ref 3.87–5.11)
RDW: 13.5 % (ref 11.5–15.5)
WBC: 7 10*3/uL (ref 4.0–10.5)
nRBC: 0 % (ref 0.0–0.2)

## 2023-01-24 LAB — DIFFERENTIAL
Abs Immature Granulocytes: 0.03 10*3/uL (ref 0.00–0.07)
Basophils Absolute: 0.1 10*3/uL (ref 0.0–0.1)
Basophils Relative: 1 %
Eosinophils Absolute: 0.2 10*3/uL (ref 0.0–0.5)
Eosinophils Relative: 3 %
Immature Granulocytes: 0 %
Lymphocytes Relative: 29 %
Lymphs Abs: 2 10*3/uL (ref 0.7–4.0)
Monocytes Absolute: 0.6 10*3/uL (ref 0.1–1.0)
Monocytes Relative: 8 %
Neutro Abs: 4.1 10*3/uL (ref 1.7–7.7)
Neutrophils Relative %: 59 %

## 2023-01-24 LAB — COMPREHENSIVE METABOLIC PANEL
ALT: 7 U/L (ref 0–44)
AST: 16 U/L (ref 15–41)
Albumin: 4.2 g/dL (ref 3.5–5.0)
Alkaline Phosphatase: 91 U/L (ref 38–126)
Anion gap: 12 (ref 5–15)
BUN: 15 mg/dL (ref 6–20)
CO2: 18 mmol/L — ABNORMAL LOW (ref 22–32)
Calcium: 9.2 mg/dL (ref 8.9–10.3)
Chloride: 106 mmol/L (ref 98–111)
Creatinine, Ser: 1.01 mg/dL — ABNORMAL HIGH (ref 0.44–1.00)
GFR, Estimated: >60 mL/min (ref >60)
Glucose, Bld: 88 mg/dL (ref 70–99)
Potassium: 4.7 mmol/L (ref 3.5–5.1)
Sodium: 136 mmol/L (ref 135–145)
Total Bilirubin: 0.6 mg/dL (ref 0.3–1.2)
Total Protein: 6.9 g/dL (ref 6.5–8.1)

## 2023-01-24 LAB — RAPID URINE DRUG SCREEN, HOSP PERFORMED
Amphetamines: NOT DETECTED
Barbiturates: NOT DETECTED
Benzodiazepines: POSITIVE — AB
Cocaine: NOT DETECTED
Opiates: NOT DETECTED
Tetrahydrocannabinol: NOT DETECTED

## 2023-01-24 LAB — CBG MONITORING, ED: Glucose-Capillary: 84 mg/dL (ref 70–99)

## 2023-01-24 LAB — ETHANOL: Alcohol, Ethyl (B): <10 mg/dL (ref <10)

## 2023-01-24 MED ORDER — SODIUM CHLORIDE 0.9 % IV BOLUS
1000.0000 mL | Freq: Once | INTRAVENOUS | Status: AC
  Administered 2023-01-24: 1000 mL via INTRAVENOUS

## 2023-01-24 MED ORDER — PROMETHAZINE HCL 25 MG/ML IJ SOLN
INTRAMUSCULAR | Status: AC
  Filled 2023-01-24: qty 1

## 2023-01-24 MED ORDER — PROMETHAZINE (PHENERGAN) 6.25MG IN NS 50ML IVPB
6.2500 mg | Freq: Once | INTRAVENOUS | Status: AC
  Administered 2023-01-24: 6.25 mg via INTRAVENOUS
  Filled 2023-01-24: qty 50

## 2023-01-24 MED ORDER — KETOROLAC TROMETHAMINE 15 MG/ML IJ SOLN
15.0000 mg | Freq: Once | INTRAMUSCULAR | Status: AC
  Administered 2023-01-24: 15 mg via INTRAVENOUS
  Filled 2023-01-24: qty 1

## 2023-01-24 NOTE — ED Notes (Signed)
Patient transported to CT 

## 2023-01-24 NOTE — ED Triage Notes (Signed)
530 pm LKW. Awoke this am with slurred speech and dizziness.pt was noted to be off-balance at work while ambulating. Co-worker has noticed pt acting like this before after she took an imitrex. However, pt was concerned enough to come to ED. Pt has had a migraine since Monday. She did take imitrex last night at 7 pm. This was her second dose this week. Pt has equal grips/sensation. Pt does states has blurry vision and still has migraine. States this comes with her migraine.

## 2023-01-24 NOTE — ED Notes (Signed)
Discharge paperwork given and verbally understood. 

## 2023-01-24 NOTE — Discharge Instructions (Addendum)
Please read and follow all provided instructions.  Your diagnoses today include:  1. Acute nonintractable headache, unspecified headache type   2. Dizziness     Tests performed today include: CT of your head which was normal and did not show any serious cause of your headache Blood cell counts and electrolytes: Were okay other than your red blood cell count being a little low, please have this rechecked and followed by your primary care doctor Vital signs. See below for your results today.   Medications:  In the Emergency Department you received: Phenergan - antinausea medicine Toradol - NSAID medication similar to ibuprofen  Take any prescribed medications only as directed.  Additional information:  Follow any educational materials contained in this packet.  You are having a headache. No specific cause was found today for your headache. It may have been a migraine or other cause of headache. Stress, anxiety, fatigue, and depression are common triggers for headaches.   Your headache today does not appear to be life-threatening or require hospitalization, but often the exact cause of headaches is not determined in the emergency department. Therefore, follow-up with your doctor is very important to find out what may have caused your headache and whether or not you need any further diagnostic testing or treatment.   Sometimes headaches can appear benign (not harmful), but then more serious symptoms can develop which should prompt an immediate re-evaluation by your doctor or the emergency department.  BE VERY CAREFUL not to take multiple medicines containing Tylenol (also called acetaminophen). Doing so can lead to an overdose which can damage your liver and cause liver failure and possibly death.   Follow-up instructions: Please follow-up with your primary care provider in the next 3 days for further evaluation of your symptoms.   Return instructions:  Please return to the Emergency  Department if you experience worsening symptoms. Return if the medications do not resolve your headache, if it recurs, or if you have multiple episodes of vomiting or cannot keep down fluids. Return if you have a change from the usual headache. RETURN IMMEDIATELY IF you: Develop a sudden, severe headache Develop confusion or become poorly responsive or faint Develop a fever above 100.59F or problem breathing Have a change in speech, vision, swallowing, or understanding Develop new weakness, numbness, tingling, incoordination in your arms or legs Have a seizure Please return if you have any other emergent concerns.  Additional Information:  Your vital signs today were: BP 125/70   Pulse 67   Temp (!) 97.5 F (36.4 C) (Oral)   Resp (!) 22   SpO2 97%  If your blood pressure (BP) was elevated above 135/85 this visit, please have this repeated by your doctor within one month. --------------

## 2023-01-24 NOTE — ED Notes (Signed)
Pt assisted to bathroom via wheelchair.

## 2023-01-24 NOTE — ED Notes (Signed)
Migraine -- Hx of same, feels like her normal when its occurring and has had the same symptoms before during the migraine... Woke up with the migraine (didn't wake her up).Marland KitchenMarland Kitchen

## 2023-01-24 NOTE — ED Provider Notes (Signed)
[place] EMERGENCY DEPARTMENT AT [facility] Provider Note   CSN: [id number] Arrival date & time: 01/24/23  6295     History  No chief complaint on file.   Kelsey Lang is a 57 y.o. female.  Patient presents to the emergency department today for evaluation of headache, dizziness, trouble walking and slurred speech.  Patient has a history of migraines.  She states that she developed a migraine around 6 AM.  She tried to go to work, at a [employer], and she was having a lot of difficulty with ambulation.  She was complaining of a headache.  Her coworker has seen her in this condition in the past, however patient typically refused to go see the doctor.  She was agreeable today.  Patient states that the symptoms occur when she has migraine headaches.  She has had ED visits for similar symptoms, such as in 2020 where she had a neuroconsult and a negative MRI.  She also had a visit in 2021 and her symptoms improved with a migraine cocktail.  Patient does take Imitrex for migraines, however tells me that she has not taken any in about 6 weeks.  Her coworker, at bedside, states that the patient may have mentioned taking 1 of these "last night".  Patient is also on several different sedating medications including gabapentin, Ambien, Phenergan.  She states that she took the gabapentin and Ambien last night, 1 pill each.  Patient is also complaining about dry mouth and I asked her if she had taken any Benadryl or antihistamines.  She states that she took Benadryl, 1 tablet, last night.  Patient denies chest pain or shortness of breath. Patient denies other signs of stroke including: facial droop, aphasia, weakness/numbness in extremities.  Coworker does state that the patient has been very stressed at work recently.        Home Medications Prior to Admission medications   Medication Sig Start Date End Date Taking? Authorizing Provider  gabapentin (NEURONTIN) 100 MG capsule Take 100 mg by  mouth 3 (three) times daily.    [provider]  meclizine (ANTIVERT) 25 MG tablet Take 1 tablet (25 mg total) by mouth 3 (three) times daily as needed for dizziness. 10/14/18   McDonald, Mia A, PA-C  metoprolol tartrate (LOPRESSOR) 25 MG tablet Take 25 mg by mouth 2 (two) times daily.    [provider]  ondansetron (ZOFRAN ODT) 4 MG disintegrating tablet Take 1 tablet (4 mg total) by mouth every 8 (eight) hours as needed. 09/22/19   McDonald, Mia A, PA-C  pantoprazole (PROTONIX) 40 MG tablet Take 80 mg by mouth 2 (two) times daily. 09/24/18   [provider]  promethazine (PHENERGAN) 25 MG tablet Take 1 tablet (25 mg total) by mouth every 6 (six) hours as needed for nausea or vomiting. 10/14/18   McDonald, Mia A, PA-C  sucralfate (CARAFATE) 1 g tablet Take 1 tablet (1 g total) by mouth 4 (four) times daily for 14 days. 10/14/18 10/28/18  McDonald, Mia A, PA-C  telmisartan (MICARDIS) 80 MG tablet Take 1 tablet (80 mg total) by mouth daily. 10/22/18   Nyoka Cowden, MD      Allergies    Penicillins, Acetaminophen, and Sulfa antibiotics    Review of Systems   Review of Systems  Physical Exam Updated Vital Signs There were no vitals taken for this visit. Physical Exam Vitals and nursing note reviewed.  Constitutional:      General: She is not in  acute distress.    Appearance: She is well-developed.  HENT:     Head: Normocephalic and atraumatic.     Right Ear: Tympanic membrane, ear canal and external ear normal.     Left Ear: Tympanic membrane, ear canal and external ear normal.     Nose: Nose normal.     Mouth/Throat:     Mouth: Mucous membranes are dry.     Pharynx: Uvula midline.     Comments: Patient's mouth is very dry and I believe that some of her dysarthria may be due to her dry mouth. Eyes:     General: Lids are normal.     Extraocular Movements:     Right eye: Nystagmus present.     Left eye: Nystagmus present.     Conjunctiva/sclera: Conjunctivae  normal.     Pupils: Pupils are equal, round, and reactive to light.     Comments: Horizontal nystagmus noted, constant with sideways gaze.  Pupils are mildly dilated bilaterally, symmetric.  Cardiovascular:     Rate and Rhythm: Normal rate and regular rhythm.     Heart sounds: No murmur heard. Pulmonary:     Effort: Pulmonary effort is normal. No respiratory distress.     Breath sounds: Normal breath sounds. No wheezing, rhonchi or rales.  Abdominal:     Palpations: Abdomen is soft.     Tenderness: There is no abdominal tenderness. There is no guarding or rebound.  Musculoskeletal:     Cervical back: Normal range of motion and neck supple. No tenderness or bony tenderness.     Right lower leg: No edema.     Left lower leg: No edema.  Skin:    General: Skin is warm and dry.     Findings: No rash.  Neurological:     General: No focal deficit present.     Mental Status: She is alert and oriented to person, place, and time.     GCS: GCS eye subscore is 4. GCS verbal subscore is 5. GCS motor subscore is 6.     Cranial Nerves: Dysarthria present. No cranial nerve deficit or facial asymmetry.     Sensory: No sensory deficit.     Motor: No weakness, abnormal muscle tone or pronator drift.     Coordination: Coordination abnormal.     Gait: Gait abnormal.     Comments: Upper extremity myotomes tested bilaterally:  C5 Shoulder abduction 5/5 C6 Elbow flexion/wrist extension 5/5 C7 Elbow extension 5/5 C8 Finger flexion 5/5 T1 Finger abduction 5/5  Lower extremity myotomes tested bilaterally: L2 Hip flexion 5/5 L3 Knee extension 5/5 L4 Ankle dorsiflexion 5/5 S1 Ankle plantar flexion 5/5   Psychiatric:        Mood and Affect: Mood normal.     ED Results / Procedures / Treatments   Labs (all labs ordered are listed, but only abnormal results are displayed) Labs Reviewed  CBC - Abnormal; Notable for the following components:      Result Value   RBC 3.81 (*)    Hemoglobin 11.2 (*)     HCT 34.2 (*)    All other components within normal limits  COMPREHENSIVE METABOLIC PANEL - Abnormal; Notable for the following components:   CO2 18 (*)    Creatinine, Ser 1.01 (*)    All other components within normal limits  DIFFERENTIAL  ETHANOL  RAPID URINE DRUG SCREEN, HOSP PERFORMED  CBG MONITORING, ED    EKG None  Radiology No results found.  Procedures Procedures  Medications Ordered in ED Medications  promethazine (PHENERGAN) 6.25 mg/NS 50 mL IVPB (has no administration in time range)  sodium chloride 0.9 % bolus 1,000 mL (1,000 mLs Intravenous New Bag/Given 01/24/23 1014)  ketorolac (TORADOL) 15 MG/ML injection 15 mg (15 mg Intravenous Given 01/24/23 1053)    ED Course/ Medical Decision Making/ A&P    Patient seen and examined. History obtained directly from patient.  I also reviewed the patient's previous ED visit history over the past 4 years including imaging including MRI.  Labs/EKG: Ordered stroke panel, except PT/INR and APTT as patient is not on any medications which I feel would prolong these.  Imaging: Ordered CT head without contrast.  Medications/Fluids: Ordered: IV fluid bolus  Most recent vital signs reviewed and are as follows: BP 134/79   Pulse 68   Temp (!) 97.5 F (36.4 C) (Oral)   Resp 14   SpO2 94%   Initial impression: Patient with ataxia and dizziness, dry mouth, reported headache.  She is on a lot of sedating medications and I think that this may be contributing, however will also consider possibility of complex migraine, stroke, other metabolic causes.  Patient denies taking more medication than she should have, however there is some inconsistencies in the history told by patient and told by coworker.  Patient is at least 4-1/2 hours outside of the stroke window, possibly longer.  Van negative, given no weakness, so code stroke not called.  11:24 AM Reassessment performed. Patient appears improved.  Her speech is more clear.  She is  awake and alert.  Patient's friend at bedside agrees that she is also improving.  Patient is currently completing IV fluids.  She is asking for something for nausea.  Zofran has not worked in the past.  I have ordered 6.25 mg of IV Phenergan to try not to sedate her again.  Labs personally reviewed and interpreted including: CBC with slight anemia with hemoglobin of 11.2, patient made aware; CMP bicarb mildly low at 18 otherwise unremarkable with normal anion gap; ethanol negative.  Imaging personally visualized and interpreted including: CT head, agree no acute findings, question of age-indeterminate infarct.  This was discussed with the patient.  Reviewed pertinent lab work and imaging with patient at bedside. Questions answered.   Most current vital signs reviewed and are as follows: BP 134/79   Pulse 68   Temp (!) 97.5 F (36.4 C) (Oral)   Resp 14   SpO2 94%   Plan: Currently symptoms are improving and patient is doing better.  She is returning to baseline.  She has had multiple episodes like this as noted in previous ED notes as well as per the patient's friend at bedside who states that typically the patient gets better and does not seek medical care.  If she is able to ambulate after she completes fluids, I feel that she would likely be able to go home and follow-up as an outpatient.  We discussed her CT findings and discussed potential for stroke, however given that her symptoms are currently following pattern as previous episodes I have low concern overall for CVA or TIA.  Symptoms may be related to complex migraine given her history versus oversedation from polypharmacy.  Patient seems to be comfortable with going home today.  Will reassess after ambulation.  1:02 PM Reassessment performed. Patient appears stable.  Patient is back to her baseline and is requesting discharge and wants to go home.  No dysarthria or incoordination.  She has ambulated well.  Labs personally reviewed and  interpreted including: UDS positive for benzodiazepine, otherwise negative.  Reviewed pertinent lab work and imaging with patient at bedside. Questions answered.   Most current vital signs reviewed and are as follows: BP 116/72 (BP Location: Right Arm)   Pulse 67   Temp (!) 97.5 F (36.4 C) (Oral)   Resp 19   SpO2 98%   Plan: Discharge to home.   Prescriptions written for: None  Other home care instructions discussed: Rest, hydration  ED return instructions discussed: Patient counseled to return if they have weakness in their arms or legs, slurred speech, trouble walking or talking, confusion, trouble with their balance, or if they have any other concerns. Patient verbalizes understanding and agrees with plan.   Follow-up instructions discussed: Patient encouraged to follow-up with their PCP in 5 days.                             Medical Decision Making Amount and/or Complexity of Data Reviewed Labs: ordered. Radiology: ordered.  Risk Prescription drug management.   In regards to the patient's headache, critical differentials were considered including subarachnoid hemorrhage, intracerebral hemorrhage, epidural/subdural hematoma, pituitary apoplexy, vertebral/carotid artery dissection, giant cell arteritis, central venous thrombosis, reversible cerebral vasoconstriction, acute angle closure glaucoma, idiopathic intracranial hypertension, bacterial meningitis, viral encephalitis, carbon monoxide poisoning, posterior reversible encephalopathy syndrome, pre-eclampsia.   Reg flag symptoms related to these causes were considered including systemic symptoms (fever, weight loss), neurologic symptoms (confusion, mental status change, vision change, associated seizure), acute or sudden "thunderclap" onset, patient age 57 or older with new or progressive headache, patient of any age with first headache or change in headache pattern, pregnant or postpartum status, history of HIV or other  immunocompromise, history of cancer, headache occurring with exertion, associated neck or shoulder pain, associated traumatic injury, concurrent use of anticoagulation, family history of spontaneous SAH, and concurrent drug use.    Other benign, more common causes of headache were considered including migraine, tension-type headache, cluster headache, referred pain from other cause such as sinus infection, dental pain, trigeminal neuralgia.   Patient has had similar symptoms of dizziness, ataxia in the past associated with migraines.  Pattern today seems consistent with previous.  She did have head imaging with a small area of hypoattenuation of unclear time course.  Overall low concern for acute stroke but will require follow-up.  Also consider that she is on several sedating medications which could cause central nervous system depression which could contribute.  Patient advised to discuss her current medication regimen with PCP and limit these medications if able.  On exam, patient has an improving neuro exam including baseline mental status, no significant neck pain or meningeal signs, no signs of severe infection or fever.   Patient wants to go home today.  She feels confident with this plan based on her previous course of symptoms.  The patient's vital signs, pertinent lab work and imaging were reviewed and interpreted as discussed in the ED course. Hospitalization was considered for further testing, treatments, or serial exams/observation. However as patient is well-appearing, has a stable exam over the course of their evaluation, and reassuring studies today, I do not feel that they warrant admission at this time. This plan was discussed with the patient who verbalizes agreement and comfort with this plan and seems reliable and able to return to the Emergency Department with worsening or changing symptoms.         Final  Clinical Impression(s) / ED Diagnoses Final diagnoses:  Acute  nonintractable headache, unspecified headache type  Dizziness    Rx / DC Orders ED Discharge Orders     None         Renne Crigler, PA-C 01/24/23 1307    Melene Plan, DO 01/24/23 1438
